# Patient Record
Sex: Male | Born: 1950
Health system: Southern US, Community
[De-identification: ages and names within clinical notes are randomized; demographics above are authoritative.]

## PROBLEM LIST (undated history)

## (undated) DIAGNOSIS — K5090 Crohn's disease, unspecified, without complications: Secondary | ICD-10-CM

## (undated) DIAGNOSIS — R972 Elevated prostate specific antigen [PSA]: Secondary | ICD-10-CM

## (undated) DIAGNOSIS — I10 Essential (primary) hypertension: Secondary | ICD-10-CM

## (undated) DIAGNOSIS — J302 Other seasonal allergic rhinitis: Secondary | ICD-10-CM

## (undated) HISTORY — PX: KNEE SURGERY: SHX244

## (undated) HISTORY — DX: Essential (primary) hypertension: I10

## (undated) HISTORY — PX: HIP SURGERY: SHX245

## (undated) HISTORY — DX: Elevated prostate specific antigen (PSA): R97.20

---

## 2000-08-07 ENCOUNTER — Encounter: Payer: Self-pay | Admitting: Orthopedic Surgery

## 2000-08-07 ENCOUNTER — Ambulatory Visit (HOSPITAL_COMMUNITY): Admission: RE | Admit: 2000-08-07 | Discharge: 2000-08-07 | Payer: Self-pay | Admitting: Orthopedic Surgery

## 2000-12-11 HISTORY — PX: OTHER SURGICAL HISTORY: SHX169

## 2001-01-28 ENCOUNTER — Encounter: Payer: Self-pay | Admitting: Orthopedic Surgery

## 2001-01-28 ENCOUNTER — Ambulatory Visit (HOSPITAL_COMMUNITY): Admission: RE | Admit: 2001-01-28 | Discharge: 2001-01-28 | Payer: Self-pay | Admitting: Orthopedic Surgery

## 2001-03-18 ENCOUNTER — Encounter: Payer: Self-pay | Admitting: Orthopedic Surgery

## 2001-03-20 ENCOUNTER — Ambulatory Visit (HOSPITAL_COMMUNITY): Admission: AD | Admit: 2001-03-20 | Discharge: 2001-03-20 | Payer: Self-pay | Admitting: Orthopedic Surgery

## 2001-03-20 ENCOUNTER — Encounter: Payer: Self-pay | Admitting: Orthopedic Surgery

## 2011-12-12 HISTORY — PX: ROOT CANAL: SHX2363

## 2012-12-11 HISTORY — PX: MOUTH SURGERY: SHX715

## 2014-07-11 ENCOUNTER — Encounter: Payer: Self-pay | Admitting: *Deleted

## 2016-06-29 ENCOUNTER — Other Ambulatory Visit: Payer: Self-pay | Admitting: Orthopedic Surgery

## 2016-06-29 DIAGNOSIS — M1711 Unilateral primary osteoarthritis, right knee: Secondary | ICD-10-CM

## 2016-07-08 ENCOUNTER — Ambulatory Visit
Admission: RE | Admit: 2016-07-08 | Discharge: 2016-07-08 | Disposition: A | Discharge status: Home / Self Care | Payer: BLUE CROSS/BLUE SHIELD | Source: Ambulatory Visit | Attending: Orthopedic Surgery | Admitting: Orthopedic Surgery

## 2016-07-08 DIAGNOSIS — M1711 Unilateral primary osteoarthritis, right knee: Secondary | ICD-10-CM

## 2016-10-16 ENCOUNTER — Encounter (HOSPITAL_COMMUNITY): Payer: Self-pay | Admitting: Emergency Medicine

## 2016-10-16 ENCOUNTER — Emergency Department (HOSPITAL_COMMUNITY)
Admission: EM | Admit: 2016-10-16 | Discharge: 2016-10-17 | Disposition: A | Discharge status: Home / Self Care | Payer: BLUE CROSS/BLUE SHIELD | Attending: Emergency Medicine | Admitting: Emergency Medicine

## 2016-10-16 ENCOUNTER — Emergency Department (HOSPITAL_COMMUNITY): Payer: BLUE CROSS/BLUE SHIELD

## 2016-10-16 DIAGNOSIS — R109 Unspecified abdominal pain: Secondary | ICD-10-CM

## 2016-10-16 DIAGNOSIS — Z79899 Other long term (current) drug therapy: Secondary | ICD-10-CM | POA: Diagnosis not present

## 2016-10-16 DIAGNOSIS — Z7982 Long term (current) use of aspirin: Secondary | ICD-10-CM | POA: Insufficient documentation

## 2016-10-16 DIAGNOSIS — R1032 Left lower quadrant pain: Secondary | ICD-10-CM | POA: Diagnosis not present

## 2016-10-16 DIAGNOSIS — I10 Essential (primary) hypertension: Secondary | ICD-10-CM | POA: Diagnosis not present

## 2016-10-16 HISTORY — DX: Crohn's disease, unspecified, without complications: K50.90

## 2016-10-16 LAB — URINALYSIS, ROUTINE W REFLEX MICROSCOPIC
BILIRUBIN URINE: NEGATIVE
GLUCOSE, UA: NEGATIVE mg/dL
KETONES UR: 40 mg/dL — AB
Leukocytes, UA: NEGATIVE
Nitrite: NEGATIVE
PH: 5.5 (ref 5.0–8.0)
Protein, ur: NEGATIVE mg/dL
Specific Gravity, Urine: 1.01 (ref 1.005–1.030)

## 2016-10-16 LAB — CBC WITH DIFFERENTIAL/PLATELET
Basophils Absolute: 0 10*3/uL (ref 0.0–0.1)
Basophils Relative: 0 %
EOS ABS: 0.1 10*3/uL (ref 0.0–0.7)
Eosinophils Relative: 0 %
HEMATOCRIT: 41.2 % (ref 39.0–52.0)
HEMOGLOBIN: 13.8 g/dL (ref 13.0–17.0)
LYMPHS ABS: 0.4 10*3/uL — AB (ref 0.7–4.0)
LYMPHS PCT: 3 %
MCH: 30.3 pg (ref 26.0–34.0)
MCHC: 33.5 g/dL (ref 30.0–36.0)
MCV: 90.4 fL (ref 78.0–100.0)
Monocytes Absolute: 0.9 10*3/uL (ref 0.1–1.0)
Monocytes Relative: 7 %
NEUTROS ABS: 10.8 10*3/uL — AB (ref 1.7–7.7)
NEUTROS PCT: 90 %
Platelets: 195 10*3/uL (ref 150–400)
RBC: 4.56 MIL/uL (ref 4.22–5.81)
RDW: 13.6 % (ref 11.5–15.5)
WBC: 12.2 10*3/uL — AB (ref 4.0–10.5)

## 2016-10-16 LAB — COMPREHENSIVE METABOLIC PANEL
ALBUMIN: 3.4 g/dL — AB (ref 3.5–5.0)
ALK PHOS: 65 U/L (ref 38–126)
ALT: 26 U/L (ref 17–63)
AST: 30 U/L (ref 15–41)
Anion gap: 9 (ref 5–15)
BUN: 12 mg/dL (ref 6–20)
CALCIUM: 9.3 mg/dL (ref 8.9–10.3)
CO2: 28 mmol/L (ref 22–32)
CREATININE: 1.14 mg/dL (ref 0.61–1.24)
Chloride: 101 mmol/L (ref 101–111)
GFR calc non Af Amer: >60 mL/min (ref >60)
GLUCOSE: 121 mg/dL — AB (ref 65–99)
Potassium: 3.4 mmol/L — ABNORMAL LOW (ref 3.5–5.1)
SODIUM: 138 mmol/L (ref 135–145)
Total Bilirubin: 1 mg/dL (ref 0.3–1.2)
Total Protein: 7.2 g/dL (ref 6.5–8.1)

## 2016-10-16 LAB — URINE MICROSCOPIC-ADD ON: Squamous Epithelial / LPF: NONE SEEN

## 2016-10-16 MED ORDER — MORPHINE SULFATE (PF) 4 MG/ML IV SOLN
4.0000 mg | Freq: Once | INTRAVENOUS | Status: AC
  Administered 2016-10-16: 4 mg via INTRAVENOUS
  Filled 2016-10-16: qty 1

## 2016-10-16 MED ORDER — SODIUM CHLORIDE 0.9 % IV BOLUS (SEPSIS)
500.0000 mL | Freq: Once | INTRAVENOUS | Status: AC
  Administered 2016-10-16: 500 mL via INTRAVENOUS

## 2016-10-16 MED ORDER — IOPAMIDOL (ISOVUE-300) INJECTION 61%
INTRAVENOUS | Status: AC
  Administered 2016-10-16: 100 mL
  Filled 2016-10-16: qty 100

## 2016-10-16 NOTE — ED Triage Notes (Signed)
Pt c/o mid lower abd pain and lower back pain.  Onset 3 days ago.  Pt denies any nausea, vomiting or diarrhea.  Last BM 2 days ago.  Pt st's he has hx of crohn's but has not had a flare up in 30-35 yrs.

## 2016-10-16 NOTE — ED Notes (Signed)
Lisa Sanders, PA at bedside at this time.  

## 2016-10-16 NOTE — ED Notes (Signed)
Patient transported to CT 

## 2016-10-16 NOTE — ED Provider Notes (Signed)
[facility] DEPT Provider Note   CSN: [id number] Arrival date & time: 10/16/16  1700     History   Chief Complaint Chief Complaint  Patient presents with  . Abdominal Pain    HPI Jeremy Casey is a 65 y.o. male.  The history is provided by the patient and medical records.  Abdominal Pain    65 year old male with history of Crohn's disease, hypertension, presenting to the ED for abdominal pain. States this began 3 days ago after eating. States generally when this occurs, he may be sore for a few hours and usually will resolve. He reports this episode has remained persistent. Reports pain is gradually worsening over the past 3 days. States he also has decreased appetite. Denies any nausea, vomiting, or diarrhea. No fever or chills. No prior abdominal surgeries. Reports he has had colonoscopy in the past which she reports was normal. Wife reports patient was taking naproxen last week, and short this is related to his symptoms. He has no history of gastric ulcers. No blood in the stool.  No urinary symptoms or difficulty urinating.  Past Medical History:  Diagnosis Date  . Crohn's disease ([place])   . Elevated PSA   . Essential hypertension, benign    [facility]    There are no active problems to display for this patient.   Past Surgical History:  Procedure Laterality Date  . HIP SURGERY    . KNEE SURGERY    . MOUTH SURGERY  2014   repeat from root canal, 2 teeth pulled  . PE tube placement  2002   secondary to serous otitis  . ROOT CANAL  2013   failed       Home Medications    Prior to Admission medications   Medication Sig Start Date End Date Taking? Authorizing Provider  aspirin 81 MG chewable tablet Chew 81 mg by mouth daily.   Yes Historical Provider, MD  fluticasone (FLONASE) 50 MCG/ACT nasal spray Place into both nostrils daily.   Yes Historical Provider, MD  olmesartan-hydrochlorothiazide (BENICAR HCT) 20-12.5 MG per tablet Take 0.5 tablets by mouth daily.     Yes Historical Provider, MD    Family History Family History  Problem Relation Age of Onset  . Hodgkin's lymphoma Father   . Cancer Father   . ALS Mother   . Hypertension Brother   . Heart attack      Social History Social History  Substance Use Topics  . Smoking status: Never Smoker  . Smokeless tobacco: Never Used  . Alcohol use Yes     Allergies   Patient has no known allergies.   Review of Systems Review of Systems  Gastrointestinal: Positive for abdominal pain.  All other systems reviewed and are negative.    Physical Exam Updated Vital Signs BP 151/90 (BP Location: Right Arm)   Pulse 66   Temp 98.5 F (36.9 C) (Oral)   Resp 18   Ht '5\' 8"'$  (1.727 m)   Wt 90.7 kg   SpO2 98%   BMI 30.41 kg/m   Physical Exam  Constitutional: He is oriented to person, place, and time. He appears well-developed and well-nourished.  HENT:  Head: Normocephalic and atraumatic.  Mouth/Throat: Oropharynx is clear and moist.  Eyes: Conjunctivae and EOM are normal. Pupils are equal, round, and reactive to light.  Neck: Normal range of motion.  Cardiovascular: Normal rate, regular rhythm and normal heart sounds.   Pulmonary/Chest: Effort normal and breath sounds normal.  Abdominal: Soft. Bowel  sounds are normal. There is tenderness. There is no tenderness at McBurney's point and negative Murphy's sign.    Abdomen soft, non-distended, normal bowel sounds, tenderness in lower abdomen, mostly suprapubic and LLQ  Musculoskeletal: Normal range of motion.  Neurological: He is alert and oriented to person, place, and time.  Skin: Skin is warm and dry.  Psychiatric: He has a normal mood and affect.  Nursing note and vitals reviewed.    ED Treatments / Results  Labs (all labs ordered are listed, but only abnormal results are displayed) Labs Reviewed  CBC WITH DIFFERENTIAL/PLATELET - Abnormal; Notable for the following:       Result Value   WBC 12.2 (*)    Neutro Abs 10.8 (*)      Lymphs Abs 0.4 (*)    All other components within normal limits  COMPREHENSIVE METABOLIC PANEL - Abnormal; Notable for the following:    Potassium 3.4 (*)    Glucose, Bld 121 (*)    Albumin 3.4 (*)    All other components within normal limits  URINALYSIS, ROUTINE W REFLEX MICROSCOPIC (NOT AT [facility]) - Abnormal; Notable for the following:    Hgb urine dipstick TRACE (*)    Ketones, ur 40 (*)    All other components within normal limits  URINE MICROSCOPIC-ADD ON - Abnormal; Notable for the following:    Bacteria, UA RARE (*)    All other components within normal limits    EKG  EKG Interpretation None       Radiology Ct Abdomen Pelvis W Contrast  Result Date: 10/16/2016 CLINICAL DATA:  Mid abdominal pain for 3 days.  Loss of appetite. EXAM: CT ABDOMEN AND PELVIS WITH CONTRAST TECHNIQUE: Multidetector CT imaging of the abdomen and pelvis was performed using the standard protocol following bolus administration of intravenous contrast. CONTRAST:  153m ISOVUE-300 IOPAMIDOL (ISOVUE-300) INJECTION 61% COMPARISON:  None. FINDINGS: Lower chest: No pulmonary nodules. No visible pleural or pericardial effusion. Hepatobiliary: Normal hepatic size and contours without focal liver lesion. No perihepatic ascites. No intra- or extrahepatic biliary dilatation. There is a large, lamellated stone within the gallbladder neck, measuring up to 2.6 cm. There is mild inflammatory stranding adjacent to the gallbladder fundus. Pancreas: Normal pancreatic contours and enhancement. No peripancreatic fluid collection or pancreatic ductal dilatation. Spleen: Normal. Adrenals/Urinary Tract: Normal adrenal glands. No hydronephrosis or solid renal mass. Stomach/Bowel: There is rectosigmoid diverticulosis without acute inflammation. No dilated small bowel. No evidence of acute inflammation. No abdominal fluid collection. Normal appendix. Vascular/Lymphatic: Normal course and caliber of the major abdominal vessels. No  abdominal or pelvic adenopathy. Reproductive: The prostate is mildly enlarged and heterogeneous. The seminal vesicles are mildly enlarged. Musculoskeletal: Lower lumbar facet arthrosis. No bony spinal canal stenosis. No lytic or blastic lesions. Normal visualized extrathoracic and extraperitoneal soft tissues. Other: No contributory non-categorized findings. IMPRESSION: 1. Large, lamellated stone within the gallbladder neck. Mild inflammatory stranding at the gallbladder fundus. These findings could indicate acute cholecystitis. Nuclear medicine hepatobiliary scan may be confirmatory. 2. No evidence of active inflammatory bowel disease. 3. Rectosigmoid diverticulosis without acute diverticulitis. Electronically Signed   By: KUlyses JarredM.D.   On: 10/16/2016 22:14    Procedures Procedures (including critical care time)  Medications Ordered in ED Medications  morphine 4 MG/ML injection 4 mg (4 mg Intravenous Given 10/16/16 2058)  sodium chloride 0.9 % bolus 500 mL (0 mLs Intravenous Stopped 10/17/16 0032)  iopamidol (ISOVUE-300) 61 % injection (100 mLs  Contrast Given 10/16/16 2139)  morphine  4 MG/ML injection 4 mg (4 mg Intravenous Given 10/16/16 2349)     Initial Impression / Assessment and Plan / ED Course  I have reviewed the triage vital signs and the nursing notes.  Pertinent labs & imaging results that were available during my care of the patient were reviewed by me and considered in my medical decision making (see chart for details).  Clinical Course    65 year old male here with abdominal pain. He is afebrile and nontoxic. Pain is mostly localized to the suprapubic and left lower quadrant. No rebound or guarding on exam. Does report history of Crohn's, states flares are usually short-lived. Lab work with mild leukocytosis. Will plan for CT scan for further evaluation.  11:38 PM Patient reassessed. Pain is better, starting to come back.  Additional meds ordered.  On repeat exam, still  only endorses pain in suprapubic and LLQ region. No TTP in RUQ, no murphy's sign.  Normal LFT's and alk phos.  CT scan revealing stone in the gallbladder neck, however given exam findings and reassuring labs, feel this is less likely the etiology of this pain and rather an incidental finding. Patient drinking Sprite at this time. UA pending.  12:10 AM Patient is tolerated oral fluids well at this time.  UA without signs of infection.  Will d/c home with supportive care.  Recommend close follow-up with PCP and/or GI if symptoms not improving in the next few days.  Discussed plan with patient, he/she acknowledged understanding and agreed with plan of care.  Return precautions given for new or worsening symptoms.  Case discussed with attending physician, Dr. Leonette Monarch, who agrees with assessment and plan of care.  Final Clinical Impressions(s) / ED Diagnoses   Final diagnoses:  Abdominal pain, unspecified abdominal location    New Prescriptions Discharge Medication List as of 10/17/2016 12:24 AM    START taking these medications   Details  ondansetron (ZOFRAN ODT) 4 MG disintegrating tablet Take 1 tablet (4 mg total) by mouth every 8 (eight) hours as needed for nausea., Starting Tue 10/17/2016, Print    oxyCODONE-acetaminophen (PERCOCET/ROXICET) 5-325 MG tablet Take 1 tablet by mouth every 4 (four) hours as needed., Starting Tue 10/17/2016, Print         Larene Pickett, PA-C 10/17/16 0139    Fatima Blank, MD 10/17/16 2359

## 2016-10-16 NOTE — ED Notes (Addendum)
Patient returned from CT

## 2016-10-17 MED ORDER — OXYCODONE-ACETAMINOPHEN 5-325 MG PO TABS: 1.0000 | ORAL_TABLET | ORAL | 0 refills | Status: DC | PRN

## 2016-10-17 MED ORDER — ONDANSETRON 4 MG PO TBDP: 4.0000 mg | ORAL_TABLET | Freq: Three times a day (TID) | ORAL | 0 refills | Status: DC | PRN

## 2016-10-17 NOTE — Discharge Instructions (Signed)
Take the prescribed medication as directed. Start with gentle diet, progress back to normal as tolerated. Follow-up with your primary care doctor. Return to the ED for new or worsening symptoms-- high fever, uncontrolled pain, nausea/vomiting, etc.

## 2016-10-18 DIAGNOSIS — I10 Essential (primary) hypertension: Secondary | ICD-10-CM | POA: Insufficient documentation

## 2016-10-18 DIAGNOSIS — K5090 Crohn's disease, unspecified, without complications: Secondary | ICD-10-CM | POA: Insufficient documentation

## 2017-08-05 DIAGNOSIS — H66001 Acute suppurative otitis media without spontaneous rupture of ear drum, right ear: Secondary | ICD-10-CM | POA: Diagnosis not present

## 2017-08-06 DIAGNOSIS — L718 Other rosacea: Secondary | ICD-10-CM | POA: Diagnosis not present

## 2017-08-06 DIAGNOSIS — D225 Melanocytic nevi of trunk: Secondary | ICD-10-CM | POA: Diagnosis not present

## 2017-08-06 DIAGNOSIS — Z85828 Personal history of other malignant neoplasm of skin: Secondary | ICD-10-CM | POA: Diagnosis not present

## 2017-08-06 DIAGNOSIS — D1801 Hemangioma of skin and subcutaneous tissue: Secondary | ICD-10-CM | POA: Diagnosis not present

## 2017-08-06 DIAGNOSIS — L814 Other melanin hyperpigmentation: Secondary | ICD-10-CM | POA: Diagnosis not present

## 2017-08-06 DIAGNOSIS — L570 Actinic keratosis: Secondary | ICD-10-CM | POA: Diagnosis not present

## 2017-08-06 DIAGNOSIS — L820 Inflamed seborrheic keratosis: Secondary | ICD-10-CM | POA: Diagnosis not present

## 2017-08-06 DIAGNOSIS — B078 Other viral warts: Secondary | ICD-10-CM | POA: Diagnosis not present

## 2017-08-06 DIAGNOSIS — L821 Other seborrheic keratosis: Secondary | ICD-10-CM | POA: Diagnosis not present

## 2017-08-08 DIAGNOSIS — I10 Essential (primary) hypertension: Secondary | ICD-10-CM | POA: Diagnosis not present

## 2017-08-08 DIAGNOSIS — H60391 Other infective otitis externa, right ear: Secondary | ICD-10-CM | POA: Diagnosis not present

## 2017-09-12 DIAGNOSIS — M1711 Unilateral primary osteoarthritis, right knee: Secondary | ICD-10-CM | POA: Diagnosis not present

## 2017-09-13 DIAGNOSIS — Z23 Encounter for immunization: Secondary | ICD-10-CM | POA: Diagnosis not present

## 2017-09-19 DIAGNOSIS — M1711 Unilateral primary osteoarthritis, right knee: Secondary | ICD-10-CM | POA: Diagnosis not present

## 2017-09-26 DIAGNOSIS — M1711 Unilateral primary osteoarthritis, right knee: Secondary | ICD-10-CM | POA: Diagnosis not present

## 2017-10-03 DIAGNOSIS — H6591 Unspecified nonsuppurative otitis media, right ear: Secondary | ICD-10-CM | POA: Diagnosis not present

## 2017-10-11 DIAGNOSIS — J309 Allergic rhinitis, unspecified: Secondary | ICD-10-CM | POA: Diagnosis not present

## 2017-10-11 DIAGNOSIS — H65191 Other acute nonsuppurative otitis media, right ear: Secondary | ICD-10-CM | POA: Diagnosis not present

## 2017-10-11 DIAGNOSIS — J329 Chronic sinusitis, unspecified: Secondary | ICD-10-CM | POA: Diagnosis not present

## 2017-10-11 DIAGNOSIS — Z8669 Personal history of other diseases of the nervous system and sense organs: Secondary | ICD-10-CM | POA: Diagnosis not present

## 2017-10-31 DIAGNOSIS — H6502 Acute serous otitis media, left ear: Secondary | ICD-10-CM | POA: Diagnosis not present

## 2017-10-31 DIAGNOSIS — J329 Chronic sinusitis, unspecified: Secondary | ICD-10-CM | POA: Diagnosis not present

## 2017-10-31 DIAGNOSIS — R05 Cough: Secondary | ICD-10-CM | POA: Diagnosis not present

## 2017-10-31 DIAGNOSIS — J300 Vasomotor rhinitis: Secondary | ICD-10-CM | POA: Diagnosis not present

## 2017-10-31 DIAGNOSIS — H6690 Otitis media, unspecified, unspecified ear: Secondary | ICD-10-CM | POA: Diagnosis not present

## 2017-11-14 DIAGNOSIS — R0981 Nasal congestion: Secondary | ICD-10-CM | POA: Diagnosis not present

## 2017-12-13 DIAGNOSIS — R05 Cough: Secondary | ICD-10-CM | POA: Diagnosis not present

## 2017-12-13 DIAGNOSIS — H6690 Otitis media, unspecified, unspecified ear: Secondary | ICD-10-CM | POA: Diagnosis not present

## 2017-12-13 DIAGNOSIS — J329 Chronic sinusitis, unspecified: Secondary | ICD-10-CM | POA: Diagnosis not present

## 2017-12-13 DIAGNOSIS — J300 Vasomotor rhinitis: Secondary | ICD-10-CM | POA: Diagnosis not present

## 2017-12-13 DIAGNOSIS — H6502 Acute serous otitis media, left ear: Secondary | ICD-10-CM | POA: Diagnosis not present

## 2017-12-18 ENCOUNTER — Other Ambulatory Visit: Payer: Self-pay | Admitting: Allergy

## 2017-12-18 ENCOUNTER — Ambulatory Visit (INDEPENDENT_AMBULATORY_CARE_PROVIDER_SITE_OTHER): Payer: Medicare Other

## 2017-12-18 DIAGNOSIS — R05 Cough: Secondary | ICD-10-CM

## 2017-12-18 DIAGNOSIS — R059 Cough, unspecified: Secondary | ICD-10-CM

## 2017-12-27 DIAGNOSIS — L02212 Cutaneous abscess of back [any part, except buttock]: Secondary | ICD-10-CM | POA: Diagnosis not present

## 2018-01-17 DIAGNOSIS — H6690 Otitis media, unspecified, unspecified ear: Secondary | ICD-10-CM | POA: Diagnosis not present

## 2018-01-17 DIAGNOSIS — H6502 Acute serous otitis media, left ear: Secondary | ICD-10-CM | POA: Diagnosis not present

## 2018-01-17 DIAGNOSIS — J300 Vasomotor rhinitis: Secondary | ICD-10-CM | POA: Diagnosis not present

## 2018-01-17 DIAGNOSIS — R05 Cough: Secondary | ICD-10-CM | POA: Diagnosis not present

## 2018-01-21 DIAGNOSIS — H6993 Unspecified Eustachian tube disorder, bilateral: Secondary | ICD-10-CM | POA: Diagnosis not present

## 2018-01-22 DIAGNOSIS — D485 Neoplasm of uncertain behavior of skin: Secondary | ICD-10-CM | POA: Diagnosis not present

## 2018-01-22 DIAGNOSIS — C44329 Squamous cell carcinoma of skin of other parts of face: Secondary | ICD-10-CM | POA: Diagnosis not present

## 2018-01-22 DIAGNOSIS — L821 Other seborrheic keratosis: Secondary | ICD-10-CM | POA: Diagnosis not present

## 2018-01-22 DIAGNOSIS — L578 Other skin changes due to chronic exposure to nonionizing radiation: Secondary | ICD-10-CM | POA: Diagnosis not present

## 2018-01-22 DIAGNOSIS — D1801 Hemangioma of skin and subcutaneous tissue: Secondary | ICD-10-CM | POA: Diagnosis not present

## 2018-01-22 DIAGNOSIS — L905 Scar conditions and fibrosis of skin: Secondary | ICD-10-CM | POA: Diagnosis not present

## 2018-02-12 DIAGNOSIS — L570 Actinic keratosis: Secondary | ICD-10-CM | POA: Diagnosis not present

## 2018-02-12 DIAGNOSIS — D485 Neoplasm of uncertain behavior of skin: Secondary | ICD-10-CM | POA: Diagnosis not present

## 2018-02-12 DIAGNOSIS — C44329 Squamous cell carcinoma of skin of other parts of face: Secondary | ICD-10-CM | POA: Diagnosis not present

## 2018-04-04 DIAGNOSIS — L814 Other melanin hyperpigmentation: Secondary | ICD-10-CM | POA: Diagnosis not present

## 2018-04-04 DIAGNOSIS — L570 Actinic keratosis: Secondary | ICD-10-CM | POA: Diagnosis not present

## 2018-04-04 DIAGNOSIS — Z85828 Personal history of other malignant neoplasm of skin: Secondary | ICD-10-CM | POA: Diagnosis not present

## 2018-04-04 DIAGNOSIS — L821 Other seborrheic keratosis: Secondary | ICD-10-CM | POA: Diagnosis not present

## 2018-04-04 DIAGNOSIS — D225 Melanocytic nevi of trunk: Secondary | ICD-10-CM | POA: Diagnosis not present

## 2018-04-24 DIAGNOSIS — M1711 Unilateral primary osteoarthritis, right knee: Secondary | ICD-10-CM | POA: Diagnosis not present

## 2018-05-01 DIAGNOSIS — M25561 Pain in right knee: Secondary | ICD-10-CM | POA: Diagnosis not present

## 2018-05-01 DIAGNOSIS — Z6830 Body mass index (BMI) 30.0-30.9, adult: Secondary | ICD-10-CM | POA: Diagnosis not present

## 2018-05-01 DIAGNOSIS — Z0000 Encounter for general adult medical examination without abnormal findings: Secondary | ICD-10-CM | POA: Diagnosis not present

## 2018-05-01 DIAGNOSIS — Z1389 Encounter for screening for other disorder: Secondary | ICD-10-CM | POA: Diagnosis not present

## 2018-05-01 DIAGNOSIS — J309 Allergic rhinitis, unspecified: Secondary | ICD-10-CM | POA: Diagnosis not present

## 2018-05-01 DIAGNOSIS — Z125 Encounter for screening for malignant neoplasm of prostate: Secondary | ICD-10-CM | POA: Diagnosis not present

## 2018-05-01 DIAGNOSIS — Z23 Encounter for immunization: Secondary | ICD-10-CM | POA: Diagnosis not present

## 2018-05-01 DIAGNOSIS — I10 Essential (primary) hypertension: Secondary | ICD-10-CM | POA: Diagnosis not present

## 2018-06-04 DIAGNOSIS — R972 Elevated prostate specific antigen [PSA]: Secondary | ICD-10-CM | POA: Diagnosis not present

## 2018-06-18 DIAGNOSIS — M1711 Unilateral primary osteoarthritis, right knee: Secondary | ICD-10-CM | POA: Diagnosis not present

## 2018-06-18 DIAGNOSIS — G8918 Other acute postprocedural pain: Secondary | ICD-10-CM | POA: Diagnosis not present

## 2018-06-19 DIAGNOSIS — M25561 Pain in right knee: Secondary | ICD-10-CM | POA: Diagnosis not present

## 2018-06-19 DIAGNOSIS — M1711 Unilateral primary osteoarthritis, right knee: Secondary | ICD-10-CM | POA: Insufficient documentation

## 2018-06-20 DIAGNOSIS — M25561 Pain in right knee: Secondary | ICD-10-CM | POA: Diagnosis not present

## 2018-06-24 DIAGNOSIS — M25561 Pain in right knee: Secondary | ICD-10-CM | POA: Diagnosis not present

## 2018-06-26 DIAGNOSIS — M25561 Pain in right knee: Secondary | ICD-10-CM | POA: Diagnosis not present

## 2018-07-01 DIAGNOSIS — M25561 Pain in right knee: Secondary | ICD-10-CM | POA: Diagnosis not present

## 2018-07-01 DIAGNOSIS — M1711 Unilateral primary osteoarthritis, right knee: Secondary | ICD-10-CM | POA: Diagnosis not present

## 2018-07-03 DIAGNOSIS — M1711 Unilateral primary osteoarthritis, right knee: Secondary | ICD-10-CM | POA: Diagnosis not present

## 2018-07-03 DIAGNOSIS — M25561 Pain in right knee: Secondary | ICD-10-CM | POA: Diagnosis not present

## 2018-07-05 DIAGNOSIS — M25561 Pain in right knee: Secondary | ICD-10-CM | POA: Diagnosis not present

## 2018-07-08 DIAGNOSIS — M25561 Pain in right knee: Secondary | ICD-10-CM | POA: Diagnosis not present

## 2018-07-11 DIAGNOSIS — M25561 Pain in right knee: Secondary | ICD-10-CM | POA: Diagnosis not present

## 2018-07-15 DIAGNOSIS — M25561 Pain in right knee: Secondary | ICD-10-CM | POA: Diagnosis not present

## 2018-07-18 DIAGNOSIS — M25561 Pain in right knee: Secondary | ICD-10-CM | POA: Diagnosis not present

## 2018-07-18 DIAGNOSIS — M1711 Unilateral primary osteoarthritis, right knee: Secondary | ICD-10-CM | POA: Diagnosis not present

## 2018-07-22 DIAGNOSIS — M1711 Unilateral primary osteoarthritis, right knee: Secondary | ICD-10-CM | POA: Diagnosis not present

## 2018-07-22 DIAGNOSIS — M25561 Pain in right knee: Secondary | ICD-10-CM | POA: Diagnosis not present

## 2018-07-25 DIAGNOSIS — M25561 Pain in right knee: Secondary | ICD-10-CM | POA: Diagnosis not present

## 2018-08-19 DIAGNOSIS — M25561 Pain in right knee: Secondary | ICD-10-CM | POA: Diagnosis not present

## 2018-08-19 DIAGNOSIS — Z4789 Encounter for other orthopedic aftercare: Secondary | ICD-10-CM | POA: Diagnosis not present

## 2018-08-19 DIAGNOSIS — M25361 Other instability, right knee: Secondary | ICD-10-CM | POA: Diagnosis not present

## 2018-08-23 DIAGNOSIS — Z23 Encounter for immunization: Secondary | ICD-10-CM | POA: Diagnosis not present

## 2018-08-23 DIAGNOSIS — H6123 Impacted cerumen, bilateral: Secondary | ICD-10-CM | POA: Diagnosis not present

## 2018-10-09 DIAGNOSIS — M25561 Pain in right knee: Secondary | ICD-10-CM | POA: Diagnosis not present

## 2018-10-24 DIAGNOSIS — L814 Other melanin hyperpigmentation: Secondary | ICD-10-CM | POA: Diagnosis not present

## 2018-10-24 DIAGNOSIS — L570 Actinic keratosis: Secondary | ICD-10-CM | POA: Diagnosis not present

## 2018-10-24 DIAGNOSIS — Z85828 Personal history of other malignant neoplasm of skin: Secondary | ICD-10-CM | POA: Diagnosis not present

## 2018-10-24 DIAGNOSIS — L821 Other seborrheic keratosis: Secondary | ICD-10-CM | POA: Diagnosis not present

## 2018-10-24 DIAGNOSIS — L578 Other skin changes due to chronic exposure to nonionizing radiation: Secondary | ICD-10-CM | POA: Diagnosis not present

## 2018-10-24 DIAGNOSIS — D225 Melanocytic nevi of trunk: Secondary | ICD-10-CM | POA: Diagnosis not present

## 2018-10-29 DIAGNOSIS — M1711 Unilateral primary osteoarthritis, right knee: Secondary | ICD-10-CM | POA: Diagnosis not present

## 2018-10-30 DIAGNOSIS — S83281A Other tear of lateral meniscus, current injury, right knee, initial encounter: Secondary | ICD-10-CM | POA: Diagnosis not present

## 2018-11-12 DIAGNOSIS — X58XXXA Exposure to other specified factors, initial encounter: Secondary | ICD-10-CM | POA: Diagnosis not present

## 2018-11-12 DIAGNOSIS — Y999 Unspecified external cause status: Secondary | ICD-10-CM | POA: Diagnosis not present

## 2018-11-12 DIAGNOSIS — Z96651 Presence of right artificial knee joint: Secondary | ICD-10-CM | POA: Diagnosis not present

## 2018-11-12 DIAGNOSIS — S83271A Complex tear of lateral meniscus, current injury, right knee, initial encounter: Secondary | ICD-10-CM | POA: Diagnosis not present

## 2018-11-12 DIAGNOSIS — M94261 Chondromalacia, right knee: Secondary | ICD-10-CM | POA: Diagnosis not present

## 2018-11-19 DIAGNOSIS — H6591 Unspecified nonsuppurative otitis media, right ear: Secondary | ICD-10-CM | POA: Diagnosis not present

## 2018-11-19 DIAGNOSIS — I10 Essential (primary) hypertension: Secondary | ICD-10-CM | POA: Diagnosis not present

## 2018-11-19 DIAGNOSIS — H66001 Acute suppurative otitis media without spontaneous rupture of ear drum, right ear: Secondary | ICD-10-CM | POA: Diagnosis not present

## 2018-11-20 DIAGNOSIS — M25561 Pain in right knee: Secondary | ICD-10-CM | POA: Diagnosis not present

## 2018-11-27 DIAGNOSIS — H66004 Acute suppurative otitis media without spontaneous rupture of ear drum, recurrent, right ear: Secondary | ICD-10-CM | POA: Diagnosis not present

## 2018-12-03 DIAGNOSIS — M25561 Pain in right knee: Secondary | ICD-10-CM | POA: Diagnosis not present

## 2018-12-09 DIAGNOSIS — M25561 Pain in right knee: Secondary | ICD-10-CM | POA: Diagnosis not present

## 2018-12-10 DIAGNOSIS — H6521 Chronic serous otitis media, right ear: Secondary | ICD-10-CM | POA: Diagnosis not present

## 2018-12-10 DIAGNOSIS — H906 Mixed conductive and sensorineural hearing loss, bilateral: Secondary | ICD-10-CM | POA: Diagnosis not present

## 2018-12-12 DIAGNOSIS — R972 Elevated prostate specific antigen [PSA]: Secondary | ICD-10-CM | POA: Diagnosis not present

## 2018-12-13 DIAGNOSIS — M25561 Pain in right knee: Secondary | ICD-10-CM | POA: Diagnosis not present

## 2018-12-17 DIAGNOSIS — M25561 Pain in right knee: Secondary | ICD-10-CM | POA: Diagnosis not present

## 2018-12-19 DIAGNOSIS — L578 Other skin changes due to chronic exposure to nonionizing radiation: Secondary | ICD-10-CM | POA: Diagnosis not present

## 2018-12-19 DIAGNOSIS — Z85828 Personal history of other malignant neoplasm of skin: Secondary | ICD-10-CM | POA: Diagnosis not present

## 2018-12-19 DIAGNOSIS — L570 Actinic keratosis: Secondary | ICD-10-CM | POA: Diagnosis not present

## 2018-12-19 DIAGNOSIS — L820 Inflamed seborrheic keratosis: Secondary | ICD-10-CM | POA: Diagnosis not present

## 2018-12-20 DIAGNOSIS — M25561 Pain in right knee: Secondary | ICD-10-CM | POA: Diagnosis not present

## 2019-01-29 DIAGNOSIS — H6531 Chronic mucoid otitis media, right ear: Secondary | ICD-10-CM | POA: Diagnosis not present

## 2019-02-18 DIAGNOSIS — R972 Elevated prostate specific antigen [PSA]: Secondary | ICD-10-CM | POA: Diagnosis not present

## 2019-02-20 DIAGNOSIS — L718 Other rosacea: Secondary | ICD-10-CM | POA: Diagnosis not present

## 2019-02-20 DIAGNOSIS — L570 Actinic keratosis: Secondary | ICD-10-CM | POA: Diagnosis not present

## 2019-02-20 DIAGNOSIS — L820 Inflamed seborrheic keratosis: Secondary | ICD-10-CM | POA: Diagnosis not present

## 2019-02-20 DIAGNOSIS — L578 Other skin changes due to chronic exposure to nonionizing radiation: Secondary | ICD-10-CM | POA: Diagnosis not present

## 2019-03-06 DIAGNOSIS — R351 Nocturia: Secondary | ICD-10-CM | POA: Diagnosis not present

## 2019-03-06 DIAGNOSIS — R972 Elevated prostate specific antigen [PSA]: Secondary | ICD-10-CM | POA: Diagnosis not present

## 2019-04-09 DIAGNOSIS — M722 Plantar fascial fibromatosis: Secondary | ICD-10-CM | POA: Diagnosis not present

## 2019-04-09 DIAGNOSIS — Z96651 Presence of right artificial knee joint: Secondary | ICD-10-CM | POA: Diagnosis not present

## 2019-04-09 DIAGNOSIS — S8391XA Sprain of unspecified site of right knee, initial encounter: Secondary | ICD-10-CM | POA: Diagnosis not present

## 2019-05-07 DIAGNOSIS — R05 Cough: Secondary | ICD-10-CM | POA: Diagnosis not present

## 2019-05-07 DIAGNOSIS — I10 Essential (primary) hypertension: Secondary | ICD-10-CM | POA: Diagnosis not present

## 2019-05-07 DIAGNOSIS — M25561 Pain in right knee: Secondary | ICD-10-CM | POA: Diagnosis not present

## 2019-05-07 DIAGNOSIS — Z0000 Encounter for general adult medical examination without abnormal findings: Secondary | ICD-10-CM | POA: Diagnosis not present

## 2019-05-07 DIAGNOSIS — J309 Allergic rhinitis, unspecified: Secondary | ICD-10-CM | POA: Diagnosis not present

## 2019-05-07 DIAGNOSIS — Z1389 Encounter for screening for other disorder: Secondary | ICD-10-CM | POA: Diagnosis not present

## 2019-05-07 DIAGNOSIS — N401 Enlarged prostate with lower urinary tract symptoms: Secondary | ICD-10-CM | POA: Diagnosis not present

## 2019-06-04 DIAGNOSIS — Z23 Encounter for immunization: Secondary | ICD-10-CM | POA: Diagnosis not present

## 2019-06-04 DIAGNOSIS — I10 Essential (primary) hypertension: Secondary | ICD-10-CM | POA: Diagnosis not present

## 2019-06-09 DIAGNOSIS — Z471 Aftercare following joint replacement surgery: Secondary | ICD-10-CM | POA: Diagnosis not present

## 2019-06-09 DIAGNOSIS — M79672 Pain in left foot: Secondary | ICD-10-CM | POA: Diagnosis not present

## 2019-06-09 DIAGNOSIS — Z96651 Presence of right artificial knee joint: Secondary | ICD-10-CM | POA: Diagnosis not present

## 2019-07-23 DIAGNOSIS — H2513 Age-related nuclear cataract, bilateral: Secondary | ICD-10-CM | POA: Diagnosis not present

## 2019-08-13 DIAGNOSIS — H6123 Impacted cerumen, bilateral: Secondary | ICD-10-CM | POA: Diagnosis not present

## 2019-08-13 DIAGNOSIS — H6993 Unspecified Eustachian tube disorder, bilateral: Secondary | ICD-10-CM | POA: Diagnosis not present

## 2019-09-05 DIAGNOSIS — R351 Nocturia: Secondary | ICD-10-CM | POA: Diagnosis not present

## 2019-09-05 DIAGNOSIS — R972 Elevated prostate specific antigen [PSA]: Secondary | ICD-10-CM | POA: Diagnosis not present

## 2019-09-24 DIAGNOSIS — H903 Sensorineural hearing loss, bilateral: Secondary | ICD-10-CM | POA: Diagnosis not present

## 2019-09-30 DIAGNOSIS — Z23 Encounter for immunization: Secondary | ICD-10-CM | POA: Diagnosis not present

## 2019-10-24 ENCOUNTER — Ambulatory Visit (INDEPENDENT_AMBULATORY_CARE_PROVIDER_SITE_OTHER): Payer: Medicare Other | Admitting: Otolaryngology

## 2019-10-24 ENCOUNTER — Other Ambulatory Visit: Payer: Self-pay

## 2019-10-24 ENCOUNTER — Encounter (INDEPENDENT_AMBULATORY_CARE_PROVIDER_SITE_OTHER): Payer: Self-pay | Admitting: Otolaryngology

## 2019-10-24 VITALS — Temp 97.7°F

## 2019-10-24 DIAGNOSIS — H6122 Impacted cerumen, left ear: Secondary | ICD-10-CM | POA: Diagnosis not present

## 2019-10-24 DIAGNOSIS — H7292 Unspecified perforation of tympanic membrane, left ear: Secondary | ICD-10-CM | POA: Diagnosis not present

## 2019-10-24 NOTE — Progress Notes (Signed)
HPI: Jeremy Casey is a 68 y.o. male who returns today for evaluation of sensation of fluid in the left ear.  He had a T-tube placed in the right ear in December 2019.  He had a Paparella tube placed in the left ear in February 2020.  He has been sensing fluid in the left ear for the past week although today it is doing much better.  He has not noted any drainage from his ear.  Denies any pain or discomfort.  Past Medical History:  Diagnosis Date  . Crohn's disease ([place])   . Elevated PSA   . Essential hypertension, benign    [facility]   Past Surgical History:  Procedure Laterality Date  . HIP SURGERY    . KNEE SURGERY    . MOUTH SURGERY  2014   repeat from root canal, 2 teeth pulled  . PE tube placement  2002   secondary to serous otitis  . ROOT CANAL  2013   failed   Social History   Socioeconomic History  . Marital status: Married    Spouse name: Not on file  . Number of children: 2  . Years of education: Not on file  . Highest education level: Not on file  Occupational History  . Not on file  Social Needs  . Financial resource strain: Not on file  . Food insecurity    Worry: Not on file    Inability: Not on file  . Transportation needs    Medical: Not on file    Non-medical: Not on file  Tobacco Use  . Smoking status: Never Smoker  . Smokeless tobacco: Never Used  Substance and Sexual Activity  . Alcohol use: Yes  . Drug use: No  . Sexual activity: Not on file  Lifestyle  . Physical activity    Days per week: Not on file    Minutes per session: Not on file  . Stress: Not on file  Relationships  . Social Herbalist on phone: Not on file    Gets together: Not on file    Attends religious service: Not on file    Active member of club or organization: Not on file    Attends meetings of clubs or organizations: Not on file    Relationship status: Not on file  Other Topics Concern  . Not on file  Social History Narrative  . Not on file   Family  History  Problem Relation Age of Onset  . Hodgkin's lymphoma Father   . Cancer Father   . ALS Mother   . Hypertension Brother   . Heart attack Unknown    No Known Allergies Prior to Admission medications   Medication Sig Start Date End Date Taking? Authorizing Provider  aspirin 81 MG chewable tablet Chew 81 mg by mouth daily.   Yes [provider]  fluticasone (FLONASE) 50 MCG/ACT nasal spray Place into both nostrils daily.   Yes [provider]  olmesartan-hydrochlorothiazide (BENICAR HCT) 20-12.5 MG per tablet Take 0.5 tablets by mouth daily.    Yes [provider]  ondansetron (ZOFRAN ODT) 4 MG disintegrating tablet Take 1 tablet (4 mg total) by mouth every 8 (eight) hours as needed for nausea. 10/17/16  Yes Larene Pickett, PA-C  oxyCODONE-acetaminophen (PERCOCET/ROXICET) 5-325 MG tablet Take 1 tablet by mouth every 4 (four) hours as needed. 10/17/16  Yes Larene Pickett, PA-C     Positive ROS: Otherwise negative  All other systems  have been reviewed and were otherwise negative with the exception of those mentioned in the HPI and as above.  Physical Exam: General: Alert, no acute distress Ears: Right ear is clear and has a patent T-tube in okay position.  No drainage noted.  Left ear reveals a partially extruded papilloma tube with some scabbing and crusting adjacent to the TM.  No drainage noted.  The tube is beginning to extrude and this was removed in the office today along with surrounding crusting and scabbing.  He had a small residual perforation after removal of the tube.  Middle ear space was dry with no drainage. Nasal: Clear nasal passages.  No signs of infection Oral: Clear oropharynx Neck: No palpable adenopathy or masses  Cerumen impaction removal  Date/Time: 10/24/2019 6:43 PM Performed by: Rozetta Nunnery, MD Authorized by: Rozetta Nunnery, MD   Consent:    Consent obtained:  Verbal   Consent given by:  Patient   Risks  discussed:  Pain and bleeding Procedure details:    Location:  L ear   Procedure type: forceps     Procedure type comment:  Paparella type I tube and surrounding crusting was removed Post-procedure details:    Inspection:  Canal normal (Small TM perforation was noted after removal.  Middle ear space was dry)   Hearing quality:  Normal   Patient tolerance of procedure:  Tolerated well, no immediate complications    Assessment: Partially extruded type I Paparella tube with sensation of fluid.  Plan: This was removed in the office today. I prescribed Floxin drops to use if he develops any drainage from the left ear. Recommended keeping the ear dry and will follow-up if he has any further problems.   Radene Journey, MD

## 2019-10-27 DIAGNOSIS — Z85828 Personal history of other malignant neoplasm of skin: Secondary | ICD-10-CM | POA: Diagnosis not present

## 2019-10-27 DIAGNOSIS — D225 Melanocytic nevi of trunk: Secondary | ICD-10-CM | POA: Diagnosis not present

## 2019-10-27 DIAGNOSIS — L820 Inflamed seborrheic keratosis: Secondary | ICD-10-CM | POA: Diagnosis not present

## 2019-10-27 DIAGNOSIS — L570 Actinic keratosis: Secondary | ICD-10-CM | POA: Diagnosis not present

## 2019-10-27 DIAGNOSIS — L814 Other melanin hyperpigmentation: Secondary | ICD-10-CM | POA: Diagnosis not present

## 2019-10-27 DIAGNOSIS — L821 Other seborrheic keratosis: Secondary | ICD-10-CM | POA: Diagnosis not present

## 2019-10-27 DIAGNOSIS — L718 Other rosacea: Secondary | ICD-10-CM | POA: Diagnosis not present

## 2019-10-28 ENCOUNTER — Encounter (INDEPENDENT_AMBULATORY_CARE_PROVIDER_SITE_OTHER): Payer: Self-pay | Admitting: Otolaryngology

## 2019-10-30 ENCOUNTER — Encounter (INDEPENDENT_AMBULATORY_CARE_PROVIDER_SITE_OTHER): Payer: Self-pay

## 2019-11-10 ENCOUNTER — Ambulatory Visit (INDEPENDENT_AMBULATORY_CARE_PROVIDER_SITE_OTHER): Payer: Medicare Other | Admitting: Otolaryngology

## 2019-11-11 ENCOUNTER — Ambulatory Visit (INDEPENDENT_AMBULATORY_CARE_PROVIDER_SITE_OTHER): Payer: Medicare Other | Admitting: Otolaryngology

## 2019-11-11 ENCOUNTER — Encounter (INDEPENDENT_AMBULATORY_CARE_PROVIDER_SITE_OTHER): Payer: Self-pay | Admitting: Otolaryngology

## 2019-11-11 ENCOUNTER — Other Ambulatory Visit: Payer: Self-pay

## 2019-11-11 VITALS — Temp 98.0°F

## 2019-11-11 DIAGNOSIS — H6692 Otitis media, unspecified, left ear: Secondary | ICD-10-CM

## 2019-11-11 DIAGNOSIS — H7292 Unspecified perforation of tympanic membrane, left ear: Secondary | ICD-10-CM

## 2019-11-11 NOTE — Progress Notes (Signed)
HPI: Jeremy Casey is a 68 y.o. male who returns today for evaluation of drainage from his left ear that started this past Saturday.  He has been using eardrops that were previously prescribed twice a day.  He has previously had tubes placed in both ears and the left tube excluded about a month ago.  He did well for several weeks but then started developing drainage from the left ear 4 days ago and has been using eardrops..  Past Medical History:  Diagnosis Date  . Crohn's disease ([place])   . Elevated PSA   . Essential hypertension, benign    [facility]   Past Surgical History:  Procedure Laterality Date  . HIP SURGERY    . KNEE SURGERY    . MOUTH SURGERY  2014   repeat from root canal, 2 teeth pulled  . PE tube placement  2002   secondary to serous otitis  . ROOT CANAL  2013   failed   Social History   Socioeconomic History  . Marital status: Married    Spouse name: Not on file  . Number of children: 2  . Years of education: Not on file  . Highest education level: Not on file  Occupational History  . Not on file  Social Needs  . Financial resource strain: Not on file  . Food insecurity    Worry: Not on file    Inability: Not on file  . Transportation needs    Medical: Not on file    Non-medical: Not on file  Tobacco Use  . Smoking status: Never Smoker  . Smokeless tobacco: Never Used  Substance and Sexual Activity  . Alcohol use: Yes  . Drug use: No  . Sexual activity: Not on file  Lifestyle  . Physical activity    Days per week: Not on file    Minutes per session: Not on file  . Stress: Not on file  Relationships  . Social Herbalist on phone: Not on file    Gets together: Not on file    Attends religious service: Not on file    Active member of club or organization: Not on file    Attends meetings of clubs or organizations: Not on file    Relationship status: Not on file  Other Topics Concern  . Not on file  Social History Narrative  . Not on file    Family History  Problem Relation Age of Onset  . Hodgkin's lymphoma Father   . Cancer Father   . ALS Mother   . Hypertension Brother   . Heart attack Unknown    No Known Allergies Prior to Admission medications   Medication Sig Start Date End Date Taking? Authorizing Provider  aspirin 81 MG chewable tablet Chew 81 mg by mouth daily.   Yes [provider]  fluticasone (FLONASE) 50 MCG/ACT nasal spray Place into both nostrils daily.   Yes [provider]  olmesartan-hydrochlorothiazide (BENICAR HCT) 20-12.5 MG per tablet Take 0.5 tablets by mouth daily.    Yes [provider]  ondansetron (ZOFRAN ODT) 4 MG disintegrating tablet Take 1 tablet (4 mg total) by mouth every 8 (eight) hours as needed for nausea. 10/17/16  Yes Larene Pickett, PA-C  oxyCODONE-acetaminophen (PERCOCET/ROXICET) 5-325 MG tablet Take 1 tablet by mouth every 4 (four) hours as needed. 10/17/16  Yes Larene Pickett, PA-C     Positive ROS: Otherwise negative  All other systems have been reviewed and were  otherwise negative with the exception of those mentioned in the HPI and as above.  Physical Exam: Constitutional: Alert, well-appearing, no acute distress Ears: External ears without lesions or tenderness.  Right ear canal and right T-tube are clear and intact.  Left TM has a anterior perforation with minimal drainage presently.  Ear canal was cleaned with suction and Ciprodex eardrops were insufflated into the middle ear space. Nasal: External nose without lesions. Septum relatively midline. Clear nasal passages Oral: Lips and gums without lesions. Tongue and palate mucosa without lesions. Posterior oropharynx clear. Neck: No palpable adenopathy or masses Respiratory: Breathing comfortably  Skin: No facial/neck lesions or rash noted.  Procedures  Assessment: Left acute otitis media Left TM perforation  Plan: He will continue with antibiotic eardrops twice a day for another 3 to 4  days. Also placed him on Ceftin 5 mg twice daily for 1 week. He will notify us if he has any persistent drainage next week. He inquired about placing a tube in the left ear again but would wait until the perforation site heals prior to placing a tube in the ear.  If the tube is placed consider placement of a T-tube.   Radene Journey, MD

## 2019-11-19 ENCOUNTER — Telehealth (INDEPENDENT_AMBULATORY_CARE_PROVIDER_SITE_OTHER): Payer: Self-pay | Admitting: Otolaryngology

## 2019-11-19 ENCOUNTER — Other Ambulatory Visit (INDEPENDENT_AMBULATORY_CARE_PROVIDER_SITE_OTHER): Payer: Self-pay | Admitting: Otolaryngology

## 2019-11-19 MED ORDER — CIPROFLOXACIN HCL 500 MG PO TABS: 500.0000 mg | ORAL_TABLET | Freq: Two times a day (BID) | ORAL | 0 refills | Status: DC

## 2019-12-15 DIAGNOSIS — L308 Other specified dermatitis: Secondary | ICD-10-CM | POA: Diagnosis not present

## 2019-12-15 DIAGNOSIS — D485 Neoplasm of uncertain behavior of skin: Secondary | ICD-10-CM | POA: Diagnosis not present

## 2019-12-15 DIAGNOSIS — R238 Other skin changes: Secondary | ICD-10-CM | POA: Diagnosis not present

## 2020-01-05 DIAGNOSIS — B029 Zoster without complications: Secondary | ICD-10-CM | POA: Diagnosis not present

## 2020-01-20 DIAGNOSIS — B029 Zoster without complications: Secondary | ICD-10-CM | POA: Diagnosis not present

## 2020-02-05 DIAGNOSIS — B029 Zoster without complications: Secondary | ICD-10-CM | POA: Diagnosis not present

## 2020-03-05 DIAGNOSIS — R972 Elevated prostate specific antigen [PSA]: Secondary | ICD-10-CM | POA: Diagnosis not present

## 2020-03-05 DIAGNOSIS — R351 Nocturia: Secondary | ICD-10-CM | POA: Diagnosis not present

## 2020-03-08 ENCOUNTER — Other Ambulatory Visit: Payer: Self-pay | Admitting: Urology

## 2020-03-08 DIAGNOSIS — R972 Elevated prostate specific antigen [PSA]: Secondary | ICD-10-CM

## 2020-03-18 ENCOUNTER — Encounter (INDEPENDENT_AMBULATORY_CARE_PROVIDER_SITE_OTHER): Payer: Self-pay

## 2020-03-18 NOTE — Progress Notes (Unsigned)
Patient called in wanting medication for ear infection while on vacation at [facility]. I called in, per Dr. Lucia Gaskins, Cipro 500 mg, BID x 10 days. No refills. Called into [facility] [phone]. PM

## 2020-03-26 ENCOUNTER — Ambulatory Visit
Admission: RE | Admit: 2020-03-26 | Discharge: 2020-03-26 | Disposition: A | Discharge status: Home / Self Care | Payer: Medicare Other | Source: Ambulatory Visit | Attending: Urology | Admitting: Urology

## 2020-03-26 DIAGNOSIS — R972 Elevated prostate specific antigen [PSA]: Secondary | ICD-10-CM

## 2020-03-26 MED ORDER — GADOBENATE DIMEGLUMINE 529 MG/ML IV SOLN
20.0000 mL | Freq: Once | INTRAVENOUS | Status: AC | PRN
  Administered 2020-03-26: 20 mL via INTRAVENOUS

## 2020-03-31 ENCOUNTER — Other Ambulatory Visit: Payer: Self-pay

## 2020-03-31 ENCOUNTER — Ambulatory Visit (INDEPENDENT_AMBULATORY_CARE_PROVIDER_SITE_OTHER): Payer: Medicare Other | Admitting: Otolaryngology

## 2020-03-31 ENCOUNTER — Encounter (INDEPENDENT_AMBULATORY_CARE_PROVIDER_SITE_OTHER): Payer: Self-pay | Admitting: Otolaryngology

## 2020-03-31 VITALS — Temp 97.7°F

## 2020-03-31 DIAGNOSIS — H7293 Unspecified perforation of tympanic membrane, bilateral: Secondary | ICD-10-CM | POA: Diagnosis not present

## 2020-03-31 DIAGNOSIS — H6983 Other specified disorders of Eustachian tube, bilateral: Secondary | ICD-10-CM | POA: Diagnosis not present

## 2020-03-31 DIAGNOSIS — H6613 Chronic tubotympanic suppurative otitis media, bilateral: Secondary | ICD-10-CM | POA: Diagnosis not present

## 2020-03-31 NOTE — Progress Notes (Signed)
HPI: Jeremy Casey is a 69 y.o. male who returns today for evaluation of recurrent drainage from his ears.  More recently on the left side.  He has had intermittent drainage which is on and off.  He had previous T tubes placed several years ago.  On previous examination 4 months ago the left T-tube had extruded.  He had a persistent small left TM perforation.  The right T-tube is still intact at that point.  Past Medical History:  Diagnosis Date  . Crohn's disease ([place])   . Elevated PSA   . Essential hypertension, benign    [facility]   Past Surgical History:  Procedure Laterality Date  . HIP SURGERY    . KNEE SURGERY    . MOUTH SURGERY  2014   repeat from root canal, 2 teeth pulled  . PE tube placement  2002   secondary to serous otitis  . ROOT CANAL  2013   failed   Social History   Socioeconomic History  . Marital status: Married    Spouse name: Not on file  . Number of children: 2  . Years of education: Not on file  . Highest education level: Not on file  Occupational History  . Not on file  Tobacco Use  . Smoking status: Never Smoker  . Smokeless tobacco: Never Used  Substance and Sexual Activity  . Alcohol use: Yes  . Drug use: No  . Sexual activity: Not on file  Other Topics Concern  . Not on file  Social History Narrative  . Not on file   Social Determinants of Health   Financial Resource Strain:   . Difficulty of Paying Living Expenses:   Food Insecurity:   . Worried About Charity fundraiser in the Last Year:   . Arboriculturist in the Last Year:   Transportation Needs:   . Film/video editor (Medical):   Marland Kitchen Lack of Transportation (Non-Medical):   Physical Activity:   . Days of Exercise per Week:   . Minutes of Exercise per Session:   Stress:   . Feeling of Stress :   Social Connections:   . Frequency of Communication with Friends and Family:   . Frequency of Social Gatherings with Friends and Family:   . Attends Religious Services:   . Active  Member of Clubs or Organizations:   . Attends Archivist Meetings:   Marland Kitchen Marital Status:    Family History  Problem Relation Age of Onset  . Hodgkin's lymphoma Father   . Cancer Father   . ALS Mother   . Hypertension Brother   . Heart attack Unknown    No Known Allergies Prior to Admission medications   Medication Sig Start Date End Date Taking? Authorizing Provider  aspirin 81 MG chewable tablet Chew 81 mg by mouth daily.   Yes [provider]  ciprofloxacin (CIPRO) 500 MG tablet Take 1 tablet (500 mg total) by mouth 2 (two) times daily. 11/19/19  Yes Rozetta Nunnery, MD  fluticasone (FLONASE) 50 MCG/ACT nasal spray Place into both nostrils daily.   Yes [provider]  olmesartan-hydrochlorothiazide (BENICAR HCT) 20-12.5 MG per tablet Take 0.5 tablets by mouth daily.    Yes [provider]  ondansetron (ZOFRAN ODT) 4 MG disintegrating tablet Take 1 tablet (4 mg total) by mouth every 8 (eight) hours as needed for nausea. 10/17/16  Yes Larene Pickett, PA-C  oxyCODONE-acetaminophen (PERCOCET/ROXICET) 5-325 MG tablet Take 1 tablet by  mouth every 4 (four) hours as needed. 10/17/16  Yes Larene Pickett, PA-C     Positive ROS: Otherwise negative  All other systems have been reviewed and were otherwise negative with the exception of those mentioned in the HPI and as above.  Physical Exam: Constitutional: Alert, well-appearing, no acute distress Ears: External ears without lesions or tenderness.  On microscopic exam in the office today there is no active drainage noted on either side.  The right T-tube has extruded lying within the ear canal and this was removed.  He has a moderate size right central TM perforation but no active drainage noted.  The left ear reveals a smaller anterior inferior TM perforation again no active drainage noted.  I applied CSF powder to both ears. Nasal: External nose without lesions. Septum midline with mild rhinitis.. Clear  nasal passages Oral: Lips and gums without lesions. Tongue and palate mucosa without lesions. Posterior oropharynx clear. Neck: No palpable adenopathy or masses Respiratory: Breathing comfortably  Skin: No facial/neck lesions or rash noted.  Procedures  Assessment: Bilateral TM perforations with chronic eustachian tube dysfunction and intermittent ear drainage.  Plan: Reviewed with patient concerning keeping water and liquids out of the ears. I refilled his ofloxacin eardrop prescription as this has been beneficial in the past when he has drainage. He will continue with use of nasal steroid spray every night to help with eustachian tube dysfunction. He will keep the ears dry. He will follow-up if he has any persistent drainage that persists beyond 1 week of antibiotic eardrop use for culture if needed. He will follow-up in 6 months for recheck.   Radene Journey, MD

## 2020-04-15 DIAGNOSIS — R351 Nocturia: Secondary | ICD-10-CM | POA: Diagnosis not present

## 2020-04-15 DIAGNOSIS — R972 Elevated prostate specific antigen [PSA]: Secondary | ICD-10-CM | POA: Diagnosis not present

## 2020-04-21 ENCOUNTER — Other Ambulatory Visit: Payer: Self-pay

## 2020-04-21 DIAGNOSIS — R972 Elevated prostate specific antigen [PSA]: Secondary | ICD-10-CM

## 2020-04-29 DIAGNOSIS — Z85828 Personal history of other malignant neoplasm of skin: Secondary | ICD-10-CM | POA: Diagnosis not present

## 2020-04-29 DIAGNOSIS — L570 Actinic keratosis: Secondary | ICD-10-CM | POA: Diagnosis not present

## 2020-04-29 DIAGNOSIS — L814 Other melanin hyperpigmentation: Secondary | ICD-10-CM | POA: Diagnosis not present

## 2020-04-29 DIAGNOSIS — L718 Other rosacea: Secondary | ICD-10-CM | POA: Diagnosis not present

## 2020-04-29 DIAGNOSIS — D1801 Hemangioma of skin and subcutaneous tissue: Secondary | ICD-10-CM | POA: Diagnosis not present

## 2020-04-29 DIAGNOSIS — L820 Inflamed seborrheic keratosis: Secondary | ICD-10-CM | POA: Diagnosis not present

## 2020-04-29 DIAGNOSIS — L578 Other skin changes due to chronic exposure to nonionizing radiation: Secondary | ICD-10-CM | POA: Diagnosis not present

## 2020-04-29 DIAGNOSIS — L905 Scar conditions and fibrosis of skin: Secondary | ICD-10-CM | POA: Diagnosis not present

## 2020-04-29 DIAGNOSIS — D225 Melanocytic nevi of trunk: Secondary | ICD-10-CM | POA: Diagnosis not present

## 2020-04-29 DIAGNOSIS — L821 Other seborrheic keratosis: Secondary | ICD-10-CM | POA: Diagnosis not present

## 2020-05-25 DIAGNOSIS — J309 Allergic rhinitis, unspecified: Secondary | ICD-10-CM | POA: Diagnosis not present

## 2020-05-25 DIAGNOSIS — N401 Enlarged prostate with lower urinary tract symptoms: Secondary | ICD-10-CM | POA: Diagnosis not present

## 2020-05-25 DIAGNOSIS — M25561 Pain in right knee: Secondary | ICD-10-CM | POA: Diagnosis not present

## 2020-05-25 DIAGNOSIS — Z1389 Encounter for screening for other disorder: Secondary | ICD-10-CM | POA: Diagnosis not present

## 2020-05-25 DIAGNOSIS — Z136 Encounter for screening for cardiovascular disorders: Secondary | ICD-10-CM | POA: Diagnosis not present

## 2020-05-25 DIAGNOSIS — I10 Essential (primary) hypertension: Secondary | ICD-10-CM | POA: Diagnosis not present

## 2020-05-25 DIAGNOSIS — Z0000 Encounter for general adult medical examination without abnormal findings: Secondary | ICD-10-CM | POA: Diagnosis not present

## 2020-07-01 ENCOUNTER — Ambulatory Visit (INDEPENDENT_AMBULATORY_CARE_PROVIDER_SITE_OTHER): Payer: Medicare Other | Admitting: Otolaryngology

## 2020-07-01 ENCOUNTER — Other Ambulatory Visit: Payer: Self-pay

## 2020-07-01 DIAGNOSIS — H6123 Impacted cerumen, bilateral: Secondary | ICD-10-CM

## 2020-07-01 NOTE — Progress Notes (Signed)
HPI: Jeremy Casey is a 69 y.o. male who presents for evaluation of wax buildup in his ears.  Jeremy Casey returns today for cleaning his ears.  He has had a previous right T-tube in the left Paparella type I tube because of fluid in the ears.  These have now extruded.  He notices slight decreased hearing which has been gradual..  Past Medical History:  Diagnosis Date  . Crohn's disease ([place])   . Elevated PSA   . Essential hypertension, benign    [facility]   Past Surgical History:  Procedure Laterality Date  . HIP SURGERY    . KNEE SURGERY    . MOUTH SURGERY  2014   repeat from root canal, 2 teeth pulled  . PE tube placement  2002   secondary to serous otitis  . ROOT CANAL  2013   failed   Social History   Socioeconomic History  . Marital status: Married    Spouse name: Not on file  . Number of children: 2  . Years of education: Not on file  . Highest education level: Not on file  Occupational History  . Not on file  Tobacco Use  . Smoking status: Never Smoker  . Smokeless tobacco: Never Used  Substance and Sexual Activity  . Alcohol use: Yes  . Drug use: No  . Sexual activity: Not on file  Other Topics Concern  . Not on file  Social History Narrative  . Not on file   Social Determinants of Health   Financial Resource Strain:   . Difficulty of Paying Living Expenses:   Food Insecurity:   . Worried About Charity fundraiser in the Last Year:   . Arboriculturist in the Last Year:   Transportation Needs:   . Film/video editor (Medical):   Marland Kitchen Lack of Transportation (Non-Medical):   Physical Activity:   . Days of Exercise per Week:   . Minutes of Exercise per Session:   Stress:   . Feeling of Stress :   Social Connections:   . Frequency of Communication with Friends and Family:   . Frequency of Social Gatherings with Friends and Family:   . Attends Religious Services:   . Active Member of Clubs or Organizations:   . Attends Archivist Meetings:   Marland Kitchen Marital  Status:    Family History  Problem Relation Age of Onset  . Hodgkin's lymphoma Father   . Cancer Father   . ALS Mother   . Hypertension Brother   . Heart attack Unknown    No Known Allergies Prior to Admission medications   Medication Sig Start Date End Date Taking? Authorizing Provider  aspirin 81 MG chewable tablet Chew 81 mg by mouth daily.    [provider]  ciprofloxacin (CIPRO) 500 MG tablet Take 1 tablet (500 mg total) by mouth 2 (two) times daily. 11/19/19   Rozetta Nunnery, MD  fluticasone (FLONASE) 50 MCG/ACT nasal spray Place into both nostrils daily.    [provider]  olmesartan-hydrochlorothiazide (BENICAR HCT) 20-12.5 MG per tablet Take 0.5 tablets by mouth daily.     [provider]  ondansetron (ZOFRAN ODT) 4 MG disintegrating tablet Take 1 tablet (4 mg total) by mouth every 8 (eight) hours as needed for nausea. 10/17/16   Larene Pickett, PA-C  oxyCODONE-acetaminophen (PERCOCET/ROXICET) 5-325 MG tablet Take 1 tablet by mouth every 4 (four) hours as needed. 10/17/16   Larene Pickett, PA-C  Positive ROS: Otherwise negative  All other systems have been reviewed and were otherwise negative with the exception of those mentioned in the HPI and as above.  Physical Exam: Constitutional: Alert, well-appearing, no acute distress Ears: External ears without lesions or tenderness. Ear canals more wax buildup on the right side compared to the left.  Both ear canals were cleaned.  He has a small anterior inferior left TM perforation and a slightly larger right TM perforation.  Both perforations are dry.  On tuning fork testing he heard about the same in both ears with the 1024 tuning fork.. Nasal: External nose without lesions. Clear nasal passages Oral: Oropharynx clear. Neck: No palpable adenopathy or masses Respiratory: Breathing comfortably  Skin: No facial/neck lesions or rash noted.  Cerumen impaction removal  Date/Time: 07/01/2020  5:12 PM Performed by: Rozetta Nunnery, MD Authorized by: Rozetta Nunnery, MD   Consent:    Consent obtained:  Verbal   Consent given by:  Patient   Risks discussed:  Pain and bleeding Procedure details:    Location:  L ear and R ear   Procedure type: curette, suction and forceps   Post-procedure details:    Inspection:  Canal normal   Hearing quality:  Improved   Patient tolerance of procedure:  Tolerated well, no immediate complications Comments:     Small anterior inferior left TM perforation and slightly larger right TM perforation    Assessment: Wax buildup in both ears.  Chronic eustachian tube dysfunction with bilateral TM perforations following placement of tubes. Sensorineural hearing loss.  Plan: He presents to discuss also hearing aids and discussed with him that if he is having trouble with his hearing would recommend proceeding with hearing aids as the TM perforations should not cause any problems although I would recommend keeping water out of the ears. He will follow-up as needed  Radene Journey, MD

## 2020-07-14 DIAGNOSIS — N289 Disorder of kidney and ureter, unspecified: Secondary | ICD-10-CM | POA: Diagnosis not present

## 2020-08-27 DIAGNOSIS — Z471 Aftercare following joint replacement surgery: Secondary | ICD-10-CM | POA: Diagnosis not present

## 2020-08-27 DIAGNOSIS — M79671 Pain in right foot: Secondary | ICD-10-CM | POA: Diagnosis not present

## 2020-08-27 DIAGNOSIS — Z9889 Other specified postprocedural states: Secondary | ICD-10-CM | POA: Diagnosis not present

## 2020-08-27 DIAGNOSIS — Z96651 Presence of right artificial knee joint: Secondary | ICD-10-CM | POA: Diagnosis not present

## 2020-08-30 DIAGNOSIS — H01001 Unspecified blepharitis right upper eyelid: Secondary | ICD-10-CM | POA: Diagnosis not present

## 2020-08-30 DIAGNOSIS — H1013 Acute atopic conjunctivitis, bilateral: Secondary | ICD-10-CM | POA: Diagnosis not present

## 2020-09-14 DIAGNOSIS — Z23 Encounter for immunization: Secondary | ICD-10-CM | POA: Diagnosis not present

## 2020-10-06 ENCOUNTER — Other Ambulatory Visit: Payer: Medicare Other

## 2020-10-06 ENCOUNTER — Other Ambulatory Visit: Payer: Self-pay

## 2020-10-06 DIAGNOSIS — R972 Elevated prostate specific antigen [PSA]: Secondary | ICD-10-CM | POA: Diagnosis not present

## 2020-10-12 ENCOUNTER — Other Ambulatory Visit: Payer: Self-pay

## 2020-10-12 DIAGNOSIS — R972 Elevated prostate specific antigen [PSA]: Secondary | ICD-10-CM

## 2020-10-13 ENCOUNTER — Ambulatory Visit: Payer: Medicare Other | Admitting: Urology

## 2020-10-21 ENCOUNTER — Other Ambulatory Visit: Payer: Medicare Other

## 2020-10-22 DIAGNOSIS — M65312 Trigger thumb, left thumb: Secondary | ICD-10-CM | POA: Insufficient documentation

## 2020-10-25 DIAGNOSIS — H1013 Acute atopic conjunctivitis, bilateral: Secondary | ICD-10-CM | POA: Diagnosis not present

## 2020-10-27 ENCOUNTER — Encounter: Payer: Self-pay | Admitting: Urology

## 2020-10-27 ENCOUNTER — Other Ambulatory Visit: Payer: Self-pay

## 2020-10-27 ENCOUNTER — Ambulatory Visit (INDEPENDENT_AMBULATORY_CARE_PROVIDER_SITE_OTHER): Payer: Medicare Other | Admitting: Urology

## 2020-10-27 VITALS — BP 118/71 | HR 88 | Temp 98.7°F | Ht 67.0 in | Wt 198.0 lb

## 2020-10-27 DIAGNOSIS — R351 Nocturia: Secondary | ICD-10-CM | POA: Diagnosis not present

## 2020-10-27 DIAGNOSIS — R972 Elevated prostate specific antigen [PSA]: Secondary | ICD-10-CM

## 2020-10-27 LAB — URINALYSIS, ROUTINE W REFLEX MICROSCOPIC
Bilirubin, UA: NEGATIVE
Glucose, UA: NEGATIVE
Ketones, UA: NEGATIVE
Leukocytes,UA: NEGATIVE
Nitrite, UA: NEGATIVE
Protein,UA: NEGATIVE
Specific Gravity, UA: 1.025 (ref 1.005–1.030)
Urobilinogen, Ur: 0.2 mg/dL (ref 0.2–1.0)
pH, UA: 5.5 (ref 5.0–7.5)

## 2020-10-27 LAB — MICROSCOPIC EXAMINATION
Bacteria, UA: NONE SEEN
Epithelial Cells (non renal): NONE SEEN /hpf (ref 0–10)
Renal Epithel, UA: NONE SEEN /hpf
WBC, UA: NONE SEEN /hpf (ref 0–5)

## 2020-10-27 NOTE — Progress Notes (Signed)
10/27/2020 11:11 AM   Jeremy Casey [birth year] [id number]  Referring provider: Maury Dus, MD [address],  [place] [address]  followup elevated PSA and Nocturia  HPI: Mr Jeremy Casey is a [phone] here for followup for elevated PSA and nocturia. PSA 4.8. Prostate MRI from 6 months ago showed no evidence of prostate cancer. He continues to have nocturia 2x on fluid management. He is not bothered by the nocturia. Stream strong. No urgency or frequency.     His records from [facility] are as follows: My PSA is elevated above the normal range.  HPI: Jeremy Casey is a 69 year-old male established patient who is here for an elevated PSA.  His PSA is 4.9. He has had PSA's drawn prior to this one. He has had elevated PSA's prior to this one. He indicates there were no urinary problems when the PSA was drawn. He has not had a prostate nodule on a physical examination. He has not had recurrent prostate infections or chronic prostatitis.   He has not been on antibiotics for prostate infections previously. He has had a prostate biopsy done. His first prostate biopsy was done 03/05/2010. He does have the pathology report from his biopsy.   03/06/2019: He was previously seen by Dr. Gaynelle Arabian in 2010-2012 and had a prostate biopsy which was negative. His PSa was 2.5 in 2018, 3.7 in 2019 and 4.9 in 2020.   09/05/2019: PSA decreased to 4.04. LUTS are stable. He tried flomax since last visit but stopped it due to hypotension.   03/05/2020: PSA increased to 5.7. No new LUTS   04/15/2020: Prostate MRI shows no evidence of macroscopic prostate cancer. No worsening LUTS     CC: I get up too often at night to urinate.  HPI: He first noticed the symptom approximately 02/08/2013. He usually gets up at night to urinate 2 times. He does not have nights when he does not get up to urinate at all. He does not have trouble falling back asleep once he has been woken up at night.   He does not usually have  swelling in his hands and feet during the day. He does not take a diuretic. He does not have to strain or bear down to start his urinary stream.   03/06/2019: The patient has had nocturia 1-2x for 5-6 years but the nocturia has worsened to 3-4x in the past month   09/05/2019: Nocturia improved to 2x with fluid management   03/05/2020: nocturia stable on fluid management    04/15/2020: Nocturia is 2x and stream is weaker at night.     AUA Symptom Score: He never has to urinate again less that two hours after he has finished urinating. Less than 20% of the time he has to start and stop again several times when he urinates. Less than 20% of the time he finds it difficult to postpone urination. He never has a weak urinary stream. He never has to push or strain to begin urination. He has to get up to urinate 1 time from the time he goes to bed until the time he gets up in the morning.     QOL Score: He would feel pleased if he had to live with his urinary condition the way it is now for the rest of his life.   Calculated QOL Symptom Score: 1     PMH: Past Medical History:  Diagnosis Date  . Crohn's disease ([place])   . Elevated PSA   .  Essential hypertension, benign    [facility]    Surgical History: Past Surgical History:  Procedure Laterality Date  . HIP SURGERY    . KNEE SURGERY    . MOUTH SURGERY  2014   repeat from root canal, 2 teeth pulled  . PE tube placement  2002   secondary to serous otitis  . ROOT CANAL  2013   failed    Home Medications:  Allergies as of 10/27/2020      Reactions   Tamsulosin Hcl Other (See Comments)   Amoxicillin Other (See Comments)      Medication List       Accurate as of October 27, 2020 11:11 AM. If you have any questions, ask your nurse or doctor.        STOP taking these medications   ciprofloxacin 500 MG tablet Commonly known as: Cipro Stopped by: Nicolette Bang, MD   ondansetron 4 MG disintegrating tablet Commonly known as: Zofran  ODT Stopped by: Nicolette Bang, MD   oxyCODONE-acetaminophen 5-325 MG tablet Commonly known as: PERCOCET/ROXICET Stopped by: Nicolette Bang, MD     TAKE these medications   aspirin 81 MG chewable tablet 1 tablet What changed: Another medication with the same name was removed. Continue taking this medication, and follow the directions you see here. Changed by: Nicolette Bang, MD   Breo Ellipta [phone] MCG/INH Aepb Generic drug: fluticasone furoate-vilanterol 1 puff   fluticasone 0.05 % cream Commonly known as: CUTIVATE fluticasone propionate 0.05 % topical cream  APPLY TOPICALLY TO FACE TWICE A DAY   fluticasone 50 MCG/ACT nasal spray Commonly known as: FLONASE Place into both nostrils daily.   olmesartan-hydrochlorothiazide 20-12.5 MG tablet Commonly known as: BENICAR HCT Take 0.5 tablets by mouth daily.       Allergies:  Allergies  Allergen Reactions  . Tamsulosin Hcl Other (See Comments)  . Amoxicillin Other (See Comments)    Family History: Family History  Problem Relation Age of Onset  . Hodgkin's lymphoma Father   . Cancer Father   . ALS Mother   . Hypertension Brother   . Heart attack Unknown     Social History:  reports that he has never smoked. He has never used smokeless tobacco. He reports current alcohol use. He reports that he does not use drugs.  ROS: All other review of systems were reviewed and are negative except what is noted above in HPI  Physical Exam: BP 118/71   Pulse 88   Temp 98.7 F (37.1 C)   Ht 5\' 7"  (1.702 m)   Wt 198 lb (89.8 kg)   BMI 31.01 kg/m   Constitutional:  Alert and oriented, No acute distress. HEENT: [place] AT, moist mucus membranes.  Trachea midline, no masses. Cardiovascular: No clubbing, cyanosis, or edema. Respiratory: Normal respiratory effort, no increased work of breathing. GI: Abdomen is soft, nontender, nondistended, no abdominal masses GU: No CVA tenderness.  Lymph: No cervical or inguinal  lymphadenopathy. Skin: No rashes, bruises or suspicious lesions. Neurologic: Grossly intact, no focal deficits, moving all 4 extremities. Psychiatric: Normal mood and affect.  Laboratory Data: Lab Results  Component Value Date   WBC 12.2 (H) 10/16/2016   HGB 13.8 10/16/2016   HCT 41.2 10/16/2016   MCV 90.4 10/16/2016   PLT 195 10/16/2016    Lab Results  Component Value Date   CREATININE 1.14 10/16/2016    No results found for: PSA  No results found for: TESTOSTERONE  No results found for: HGBA1C  Urinalysis  Component Value Date/Time   COLORURINE YELLOW 10/16/2016 2320   APPEARANCEUR CLEAR 10/16/2016 2320   LABSPEC 1.010 10/16/2016 2320   PHURINE 5.5 10/16/2016 2320   GLUCOSEU NEGATIVE 10/16/2016 2320   HGBUR TRACE (A) 10/16/2016 2320   BILIRUBINUR NEGATIVE 10/16/2016 2320   KETONESUR 40 (A) 10/16/2016 2320   PROTEINUR NEGATIVE 10/16/2016 2320   NITRITE NEGATIVE 10/16/2016 2320   LEUKOCYTESUR NEGATIVE 10/16/2016 2320    Lab Results  Component Value Date   BACTERIA RARE (A) 10/16/2016    Pertinent Imaging:  No results found for this or any previous visit.  No results found for this or any previous visit.  No results found for this or any previous visit.  No results found for this or any previous visit.  No results found for this or any previous visit.  No results found for this or any previous visit.  No results found for this or any previous visit.  No results found for this or any previous visit.   Assessment & Plan:    1. Elevated prostate specific antigen (PSA) -RTC 6 months with PSA and DRE  2. Nocturia Continue fluid management   No follow-ups on file.  Nicolette Bang, MD  [facility] [place]

## 2020-10-27 NOTE — Addendum Note (Signed)
Addended by: Valentina Lucks on: 10/27/2020 03:55 PM   Modules accepted: Orders

## 2020-10-27 NOTE — Progress Notes (Signed)

## 2020-10-27 NOTE — Patient Instructions (Signed)
Prostate-Specific Antigen Test Why am I having this test? The prostate-specific antigen (PSA) test is a screening test for prostate cancer. It can identify early signs of prostate cancer, which may allow for more effective treatment. Your health care provider may recommend that you have a PSA test starting at age 68 or that you have one earlier or later, depending on your risk factors for prostate cancer. You may also have a PSA test:  To monitor treatment of prostate cancer.  To check whether prostate cancer has returned after treatment.  If you have signs of other conditions that can affect PSA levels, such as: ? An enlarged prostate that is not caused by cancer (benign prostatic hyperplasia, BPH). This condition is very common in older men. ? A prostate infection. What is being tested? This test measures the amount of PSA in your blood. PSA is a protein that is made in the prostate. The prostate naturally produces more PSA as you age, but very high levels may be a sign of a medical condition. What kind of sample is taken?  A blood sample is required for this test. It is usually collected by inserting a needle into a blood vessel or by sticking a finger with a small needle. Blood for this test should be drawn before having an exam of the prostate. How do I prepare for this test? Do not ejaculate starting 24 hours before your test, or as long as told by your health care provider. Tell a health care provider about:  Any allergies you have.  All medicines you are taking, including vitamins, herbs, eye drops, creams, and over-the-counter medicines. This also includes: ? Medicines to assist with hair growth, such as finasteride. ? Any recent exposure to a medicine called diethylstilbestrol.  Any blood disorders you have.  Any recent procedures you have had, especially any procedures involving the prostate or rectum.  Any medical conditions you have.  Any recent urinary tract infections  (UTIs) you have had. How are the results reported? Your test results will be reported as a value that indicates how much PSA is in your blood. This will be given as nanograms of PSA per milliliter of blood (ng/mL). Your health care provider will compare your results to normal ranges that were established after testing a large group of people (reference ranges). Reference ranges may vary among labs and hospitals. PSA levels vary from person to person and generally increase with age. Because of this variation, there is no single PSA value that is considered normal for everyone. Instead, PSA reference ranges are used to describe whether your PSA levels are considered low or high (elevated). Common reference ranges are:  Low: 0-2.5 ng/mL.  Slightly to moderately elevated: 2.6-10.0 ng/mL.  Moderately elevated: 10.0-19.9 ng/mL.  Significantly elevated: 20 ng/mL or greater. Sometimes, the test results may report that a condition is present when it is not present (false-positive result). What do the results mean? A test result that is higher than 4 ng/mL may mean that you are at an increased risk for prostate cancer. However, a PSA test by itself is not enough to diagnose prostate cancer. High PSA levels may also be caused by the natural aging process, prostate infection, or BPH. PSA screening cannot tell you if your PSA is high due to cancer or a different cause. A prostate biopsy is the only way to diagnose prostate cancer. A risk of having the PSA test is diagnosing and treating prostate cancer that would never have caused any   symptoms or problems (overdiagnosis and overtreatment). Talk with your health care provider about what your results mean. Questions to ask your health care provider Ask your health care provider, or the department that is doing the test:  When will my results be ready?  How will I get my results?  What are my treatment options?  What other tests do I need?  What are my  next steps? Summary  The prostate-specific antigen (PSA) test is a screening test for prostate cancer.  Your health care provider may recommend that you have a PSA test starting at age 68 or that you have one earlier or later, depending on your risk factors for prostate cancer.  A test result that is higher than 4 ng/mL may mean that you are at an increased risk for prostate cancer. However, elevated levels can be caused by a number of conditions other than prostate cancer.  Talk with your health care provider about what your results mean. This information is not intended to replace advice given to you by your health care provider. Make sure you discuss any questions you have with your health care provider. Document Revised: 11/09/2017 Document Reviewed: 09/03/2017 Elsevier Patient Education  2020 Elsevier Inc.  

## 2020-11-01 DIAGNOSIS — L820 Inflamed seborrheic keratosis: Secondary | ICD-10-CM | POA: Diagnosis not present

## 2020-11-01 DIAGNOSIS — L570 Actinic keratosis: Secondary | ICD-10-CM | POA: Diagnosis not present

## 2020-11-01 DIAGNOSIS — L579 Skin changes due to chronic exposure to nonionizing radiation, unspecified: Secondary | ICD-10-CM | POA: Diagnosis not present

## 2020-11-09 ENCOUNTER — Ambulatory Visit: Payer: Medicare Other | Attending: Internal Medicine

## 2020-11-09 ENCOUNTER — Other Ambulatory Visit (HOSPITAL_BASED_OUTPATIENT_CLINIC_OR_DEPARTMENT_OTHER): Payer: Self-pay | Admitting: Internal Medicine

## 2020-11-09 DIAGNOSIS — Z23 Encounter for immunization: Secondary | ICD-10-CM

## 2020-11-09 MED FILL — MODERNA COVID-19 VACCINE 10: 100 | 1 days supply | Qty: 0 | Fill #0

## 2020-11-09 NOTE — Progress Notes (Signed)
   Covid-19 Vaccination Clinic  Name:  MONTERRIUS CARDOSA    MRN: [id number] DOB: [birth year]  11/09/2020  Mr. Stoudt was observed post Covid-19 immunization for 15 minutes without incident. He was provided with Vaccine Information Sheet and instruction to access the V-Safe system.   Mr. Borgerding was instructed to call 911 with any severe reactions post vaccine: Marland Kitchen Difficulty breathing  . Swelling of face and throat  . A fast heartbeat  . A bad rash all over body  . Dizziness and weakness   Immunizations Administered    No immunizations on file.

## 2020-11-19 DIAGNOSIS — M65312 Trigger thumb, left thumb: Secondary | ICD-10-CM | POA: Diagnosis not present

## 2021-01-04 DIAGNOSIS — L570 Actinic keratosis: Secondary | ICD-10-CM | POA: Diagnosis not present

## 2021-01-25 DIAGNOSIS — M65312 Trigger thumb, left thumb: Secondary | ICD-10-CM | POA: Diagnosis not present

## 2021-02-14 DIAGNOSIS — L718 Other rosacea: Secondary | ICD-10-CM | POA: Diagnosis not present

## 2021-02-14 DIAGNOSIS — L810 Postinflammatory hyperpigmentation: Secondary | ICD-10-CM | POA: Diagnosis not present

## 2021-02-24 DIAGNOSIS — M65312 Trigger thumb, left thumb: Secondary | ICD-10-CM | POA: Diagnosis not present

## 2021-03-18 ENCOUNTER — Other Ambulatory Visit: Payer: Self-pay

## 2021-03-18 ENCOUNTER — Ambulatory Visit (INDEPENDENT_AMBULATORY_CARE_PROVIDER_SITE_OTHER): Payer: Medicare HMO | Admitting: Otolaryngology

## 2021-03-18 VITALS — Temp 97.9°F

## 2021-03-18 DIAGNOSIS — H72813 Multiple perforations of tympanic membrane, bilateral: Secondary | ICD-10-CM

## 2021-03-18 DIAGNOSIS — H6123 Impacted cerumen, bilateral: Secondary | ICD-10-CM | POA: Diagnosis not present

## 2021-03-18 DIAGNOSIS — H903 Sensorineural hearing loss, bilateral: Secondary | ICD-10-CM | POA: Diagnosis not present

## 2021-03-18 NOTE — Progress Notes (Signed)
HPI: Jeremy Casey is a 70 y.o. male who returns today for evaluation of his hearing.  He has noticed gradual worsening of his hearing.  He has had history of hearing loss as well as bilateral serous otitis media and required several sets of tubes in the past.  He has not been having any drainage from his ears.  He states that he is taken antihistamines on a regular basis and this seems to help with any ear infections. He also relates to me that he had a history of having steroids prescribed to him by Dr. Theda Sers for plantar fasciitis and shortly after taking the steroids he felt like his hearing improved dramatically for short while. He has had history of wax buildup in his ears..  Past Medical History:  Diagnosis Date  . Crohn's disease ([place])   . Elevated PSA   . Essential hypertension, benign    [facility]   Past Surgical History:  Procedure Laterality Date  . HIP SURGERY    . KNEE SURGERY    . MOUTH SURGERY  2014   repeat from root canal, 2 teeth pulled  . PE tube placement  2002   secondary to serous otitis  . ROOT CANAL  2013   failed   Social History   Socioeconomic History  . Marital status: Married    Spouse name: Not on file  . Number of children: 2  . Years of education: Not on file  . Highest education level: Not on file  Occupational History  . Not on file  Tobacco Use  . Smoking status: Never Smoker  . Smokeless tobacco: Never Used  Substance and Sexual Activity  . Alcohol use: Yes  . Drug use: No  . Sexual activity: Not on file  Other Topics Concern  . Not on file  Social History Narrative  . Not on file   Social Determinants of Health   Financial Resource Strain: Not on file  Food Insecurity: Not on file  Transportation Needs: Not on file  Physical Activity: Not on file  Stress: Not on file  Social Connections: Not on file   Family History  Problem Relation Age of Onset  . Hodgkin's lymphoma Father   . Cancer Father   . ALS Mother   . Hypertension  Brother   . Heart attack Unknown    Allergies  Allergen Reactions  . Tamsulosin Hcl Other (See Comments)  . Amoxicillin Other (See Comments)   Prior to Admission medications   Medication Sig Start Date End Date Taking? Authorizing Provider  aspirin 81 MG chewable tablet 1 tablet    [provider]  COVID-19 mRNA vaccine, Moderna, 100 MCG/0.5ML injection INJECT AS DIRECTED 11/09/20 11/09/21  Carlyle Basques, MD  fluticasone (CUTIVATE) 0.05 % cream fluticasone propionate 0.05 % topical cream  APPLY TOPICALLY TO FACE TWICE A DAY    [provider]  fluticasone (FLONASE) 50 MCG/ACT nasal spray Place into both nostrils daily.    [provider]  fluticasone furoate-vilanterol (BREO ELLIPTA) 200-25 MCG/INH AEPB 1 puff 05/07/19   [provider]  olmesartan-hydrochlorothiazide (BENICAR HCT) 20-12.5 MG per tablet Take 0.5 tablets by mouth daily.     [provider]     Positive ROS: Otherwise negative  All other systems have been reviewed and were otherwise negative with the exception of those mentioned in the HPI and as above.  Physical Exam: Constitutional: Alert, well-appearing, no acute distress Ears: External ears without lesions or tenderness. Ear canals with  a mild amount of wax in both ear canals that was cleaned with curettes and forceps.  After cleaning the ear canals the TMs were clear of note he had small anterior inferior TM perforations bilaterally the right side slightly larger than the left.  But these are dry with no middle ear abnormality noted.  On tuning fork testing Weber was midline.  AC was greater than BC bilaterally with the 512 fork.  He heard symmetrically with the 1024 tuning fork but had mild to moderate bilateral sensorineural hearing loss. Nasal: External nose without lesions. Septum relatively midline with mild rhinitis.. Clear nasal passages bilaterally. Oral: Lips and gums without lesions. Tongue and palate mucosa  without lesions. Posterior oropharynx clear. Neck: No palpable adenopathy or masses Respiratory: Breathing comfortably  Skin: No facial/neck lesions or rash noted.  Cerumen impaction removal  Date/Time: 03/18/2021 4:05 PM Performed by: Rozetta Nunnery, MD Authorized by: Rozetta Nunnery, MD   Consent:    Consent obtained:  Verbal   Consent given by:  Patient   Risks discussed:  Pain and bleeding Procedure details:    Location:  L ear and R ear   Procedure type: curette and forceps   Post-procedure details:    Inspection:  TM intact and canal normal   Hearing quality:  Improved   Patient tolerance of procedure:  Tolerated well, no immediate complications Comments:     TMs are clear bilaterally but he has small anterior inferior TM perforations in both TMs.    Assessment: Patient has mild to moderate bilateral sensorineural hearing loss. He also had moderate amount of wax buildup in both ear canals. He has bilateral TM perforations from previous placement of myringotomy tubes.  Plan: Would recommend obtaining a audiogram as his last hearing test was over a year and a half ago.  He would probably be a candidate for hearing aids and discussed this with him. Recommend keeping liquid or irrigations out of his ears as he has bilateral TM perforations. He heard better following cleaning the ears in the office today and will follow up here as needed for ear cleaning.   Radene Journey, MD

## 2021-04-22 ENCOUNTER — Other Ambulatory Visit: Payer: Self-pay

## 2021-04-22 ENCOUNTER — Other Ambulatory Visit: Payer: Medicare HMO

## 2021-04-22 DIAGNOSIS — R972 Elevated prostate specific antigen [PSA]: Secondary | ICD-10-CM | POA: Diagnosis not present

## 2021-04-23 LAB — PSA, TOTAL AND FREE
PSA, Free Pct: 45.6 %
PSA, Free: 1.55 ng/mL
Prostate Specific Ag, Serum: 3.4 ng/mL (ref 0.0–4.0)

## 2021-04-29 ENCOUNTER — Ambulatory Visit (INDEPENDENT_AMBULATORY_CARE_PROVIDER_SITE_OTHER): Payer: Medicare HMO | Admitting: Urology

## 2021-04-29 ENCOUNTER — Encounter: Payer: Self-pay | Admitting: Urology

## 2021-04-29 ENCOUNTER — Other Ambulatory Visit: Payer: Self-pay

## 2021-04-29 VITALS — BP 129/75 | HR 76 | Ht 67.0 in | Wt 193.0 lb

## 2021-04-29 DIAGNOSIS — R972 Elevated prostate specific antigen [PSA]: Secondary | ICD-10-CM | POA: Diagnosis not present

## 2021-04-29 DIAGNOSIS — R351 Nocturia: Secondary | ICD-10-CM | POA: Diagnosis not present

## 2021-04-29 LAB — MICROSCOPIC EXAMINATION
Bacteria, UA: NONE SEEN
Epithelial Cells (non renal): NONE SEEN /hpf (ref 0–10)
Renal Epithel, UA: NONE SEEN /hpf
WBC, UA: NONE SEEN /hpf (ref 0–5)

## 2021-04-29 LAB — URINALYSIS, ROUTINE W REFLEX MICROSCOPIC
Bilirubin, UA: NEGATIVE
Glucose, UA: NEGATIVE
Ketones, UA: NEGATIVE
Leukocytes,UA: NEGATIVE
Nitrite, UA: NEGATIVE
Protein,UA: NEGATIVE
Specific Gravity, UA: 1.02 (ref 1.005–1.030)
Urobilinogen, Ur: 0.2 mg/dL (ref 0.2–1.0)
pH, UA: 6 (ref 5.0–7.5)

## 2021-04-29 LAB — BLADDER SCAN AMB NON-IMAGING: Scan Result: 37

## 2021-04-29 NOTE — Progress Notes (Signed)
post void residual=37  Urological Symptom Review  Patient is experiencing the following symptoms: Frequent urination Get up at night to urinate Leakage of urine Stream starts and stops   Review of Systems  Gastrointestinal (upper)  : Negative for upper GI symptoms  Gastrointestinal (lower) : Negative for lower GI symptoms  Constitutional : Negative for symptoms  Skin: Negative for skin symptoms  Eyes: Negative for eye symptoms  Ear/Nose/Throat : Negative for Ear/Nose/Throat symptoms  Hematologic/Lymphatic: Negative for Hematologic/Lymphatic symptoms  Cardiovascular : Negative for cardiovascular symptoms  Respiratory : Negative for respiratory symptoms  Endocrine: Negative for endocrine symptoms  Musculoskeletal: Negative for musculoskeletal symptoms  Neurological: Negative for neurological symptoms  Psychologic: Negative for psychiatric symptoms

## 2021-04-29 NOTE — Patient Instructions (Signed)

## 2021-04-29 NOTE — Progress Notes (Signed)
04/29/2021 11:11 AM   Jeremy Casey 1951/01/17 [id number]  Referring provider: Maury Dus, MD [place],  [place] [address]  followup elevated PSA and nocturia  HPI: Mr Meter is a [phone] here for followup for elevated PSA and nocturia. IPSS 9 QOL 3. Nocturia 2x. PSA decreased to 3.4 from 4.8. He has worsening dribbling which does not bother hiom   PMH: Past Medical History:  Diagnosis Date  . Crohn's disease ([place])   . Elevated PSA   . Essential hypertension, benign    [facility]    Surgical History: Past Surgical History:  Procedure Laterality Date  . HIP SURGERY    . KNEE SURGERY    . MOUTH SURGERY  2014   repeat from root canal, 2 teeth pulled  . PE tube placement  2002   secondary to serous otitis  . ROOT CANAL  2013   failed    Home Medications:  Allergies as of 04/29/2021      Reactions   Tamsulosin Hcl Other (See Comments)   Amoxicillin Other (See Comments)      Medication List       Accurate as of Apr 29, 2021 11:11 AM. If you have any questions, ask your nurse or doctor.        aspirin 81 MG chewable tablet 1 tablet   Breo Ellipta 200-25 MCG/INH Aepb Generic drug: fluticasone furoate-vilanterol 1 puff   fluticasone 0.05 % cream Commonly known as: CUTIVATE fluticasone propionate 0.05 % topical cream  APPLY TOPICALLY TO FACE TWICE A DAY   fluticasone 50 MCG/ACT nasal spray Commonly known as: FLONASE Place into both nostrils daily.   Moderna COVID-19 Vaccine 100 MCG/0.5ML injection Generic drug: COVID-19 mRNA vaccine (Moderna) INJECT AS DIRECTED   olmesartan-hydrochlorothiazide 20-12.5 MG tablet Commonly known as: BENICAR HCT Take 0.5 tablets by mouth daily.       Allergies:  Allergies  Allergen Reactions  . Tamsulosin Hcl Other (See Comments)  . Amoxicillin Other (See Comments)    Family History: Family History  Problem Relation Age of Onset  . Hodgkin's lymphoma Father   . Cancer Father   . ALS  Mother   . Hypertension Brother   . Heart attack Unknown     Social History:  reports that he has never smoked. He has never used smokeless tobacco. He reports current alcohol use. He reports that he does not use drugs.  ROS: All other review of systems were reviewed and are negative except what is noted above in HPI  Physical Exam: BP 129/75   Pulse 76   Ht 5\' 7"  (1.702 m)   Wt 193 lb (87.5 kg)   BMI 30.23 kg/m   Constitutional:  Alert and oriented, No acute distress. HEENT: [place] AT, moist mucus membranes.  Trachea midline, no masses. Cardiovascular: No clubbing, cyanosis, or edema. Respiratory: Normal respiratory effort, no increased work of breathing. GI: Abdomen is soft, nontender, nondistended, no abdominal masses GU: No CVA tenderness. Prostate 60g smooth, no nodules. No induration Lymph: No cervical or inguinal lymphadenopathy. Skin: No rashes, bruises or suspicious lesions. Neurologic: Grossly intact, no focal deficits, moving all 4 extremities. Psychiatric: Normal mood and affect.  Laboratory Data: Lab Results  Component Value Date   WBC 12.2 (H) 10/16/2016   HGB 13.8 10/16/2016   HCT 41.2 10/16/2016   MCV 90.4 10/16/2016   PLT 195 10/16/2016    Lab Results  Component Value Date   CREATININE 1.14 10/16/2016  No results found for: PSA  No results found for: TESTOSTERONE  No results found for: HGBA1C  Urinalysis    Component Value Date/Time   COLORURINE YELLOW 10/16/2016 2320   APPEARANCEUR Clear 10/27/2020 1624   LABSPEC 1.010 10/16/2016 2320   PHURINE 5.5 10/16/2016 2320   GLUCOSEU Negative 10/27/2020 1624   HGBUR TRACE (A) 10/16/2016 2320   BILIRUBINUR Negative 10/27/2020 1624   KETONESUR 40 (A) 10/16/2016 2320   PROTEINUR Negative 10/27/2020 1624   PROTEINUR NEGATIVE 10/16/2016 2320   NITRITE Negative 10/27/2020 1624   NITRITE NEGATIVE 10/16/2016 2320   LEUKOCYTESUR Negative 10/27/2020 1624    Lab Results  Component Value Date   LABMICR  See below: 10/27/2020   WBCUA None seen 10/27/2020   LABEPIT None seen 10/27/2020   MUCUS Present 10/27/2020   BACTERIA None seen 10/27/2020    Pertinent Imaging:  No results found for this or any previous visit.  No results found for this or any previous visit.  No results found for this or any previous visit.  No results found for this or any previous visit.  No results found for this or any previous visit.  No results found for this or any previous visit.  No results found for this or any previous visit.  No results found for this or any previous visit.   Assessment & Plan:    1. Elevated prostate specific antigen (PSA) -RTC 6 months with PSA - Urinalysis, Routine w reflex microscopic  2. Nocturia -Patient defers therapy at this time - BLADDER SCAN AMB NON-IMAGING   Return in about 6 months (around 10/30/2021) for PSA.  Nicolette Bang, MD  [facility] [place]

## 2021-06-06 ENCOUNTER — Other Ambulatory Visit (INDEPENDENT_AMBULATORY_CARE_PROVIDER_SITE_OTHER): Payer: Self-pay | Admitting: Otolaryngology

## 2021-06-06 ENCOUNTER — Telehealth (INDEPENDENT_AMBULATORY_CARE_PROVIDER_SITE_OTHER): Payer: Self-pay | Admitting: Otolaryngology

## 2021-06-06 DIAGNOSIS — Z0000 Encounter for general adult medical examination without abnormal findings: Secondary | ICD-10-CM | POA: Diagnosis not present

## 2021-06-06 DIAGNOSIS — Z1389 Encounter for screening for other disorder: Secondary | ICD-10-CM | POA: Diagnosis not present

## 2021-06-06 DIAGNOSIS — N1831 Chronic kidney disease, stage 3a: Secondary | ICD-10-CM | POA: Diagnosis not present

## 2021-06-06 DIAGNOSIS — I129 Hypertensive chronic kidney disease with stage 1 through stage 4 chronic kidney disease, or unspecified chronic kidney disease: Secondary | ICD-10-CM | POA: Diagnosis not present

## 2021-06-06 DIAGNOSIS — J309 Allergic rhinitis, unspecified: Secondary | ICD-10-CM | POA: Diagnosis not present

## 2021-06-06 DIAGNOSIS — M25561 Pain in right knee: Secondary | ICD-10-CM | POA: Diagnosis not present

## 2021-06-06 DIAGNOSIS — N401 Enlarged prostate with lower urinary tract symptoms: Secondary | ICD-10-CM | POA: Diagnosis not present

## 2021-06-06 MED ORDER — OFLOXACIN 0.3 % OT SOLN: 5.0000 [drp] | Freq: Every day | OTIC | 0 refills | Status: AC

## 2021-06-06 NOTE — Telephone Encounter (Signed)
At patient's request I called and Floxin otic eardrops 4 to 5 drops twice daily x5 days as needed draining ear into his pharmacy at [facility] on [address].

## 2021-06-20 ENCOUNTER — Other Ambulatory Visit: Payer: Self-pay

## 2021-06-20 ENCOUNTER — Ambulatory Visit (INDEPENDENT_AMBULATORY_CARE_PROVIDER_SITE_OTHER): Payer: Medicare HMO | Admitting: Otolaryngology

## 2021-06-20 DIAGNOSIS — H7293 Unspecified perforation of tympanic membrane, bilateral: Secondary | ICD-10-CM

## 2021-06-20 DIAGNOSIS — H903 Sensorineural hearing loss, bilateral: Secondary | ICD-10-CM

## 2021-06-20 DIAGNOSIS — H6123 Impacted cerumen, bilateral: Secondary | ICD-10-CM | POA: Diagnosis not present

## 2021-06-20 NOTE — Progress Notes (Signed)
HPI: Jeremy Casey is a 70 y.o. male who returns today for evaluation of wax buildup in his ears.  He has noticed gradual progressive hearing loss..  Past Medical History:  Diagnosis Date   Crohn's disease ([place])    Elevated PSA    Essential hypertension, benign    [facility]   Past Surgical History:  Procedure Laterality Date   HIP SURGERY     KNEE SURGERY     MOUTH SURGERY  2014   repeat from root canal, 2 teeth pulled   PE tube placement  2002   secondary to serous otitis   ROOT CANAL  2013   failed   Social History   Socioeconomic History   Marital status: Married    Spouse name: Not on file   Number of children: 2   Years of education: Not on file   Highest education level: Not on file  Occupational History   Not on file  Tobacco Use   Smoking status: Never   Smokeless tobacco: Never  Substance and Sexual Activity   Alcohol use: Yes   Drug use: No   Sexual activity: Not on file  Other Topics Concern   Not on file  Social History Narrative   Not on file   Social Determinants of Health   Financial Resource Strain: Not on file  Food Insecurity: Not on file  Transportation Needs: Not on file  Physical Activity: Not on file  Stress: Not on file  Social Connections: Not on file   Family History  Problem Relation Age of Onset   Hodgkin's lymphoma Father    Cancer Father    ALS Mother    Hypertension Brother    Heart attack Unknown    Allergies  Allergen Reactions   Tamsulosin Hcl Other (See Comments)   Amoxicillin Other (See Comments)   Prior to Admission medications   Medication Sig Start Date End Date Taking? Authorizing Provider  aspirin 81 MG chewable tablet 1 tablet    [provider]  COVID-19 mRNA vaccine, Moderna, 100 MCG/0.5ML injection INJECT AS DIRECTED 11/09/20 11/09/21  Carlyle Basques, MD  fluticasone (CUTIVATE) 0.05 % cream fluticasone propionate 0.05 % topical cream  APPLY TOPICALLY TO FACE TWICE A DAY    [provider]   fluticasone (FLONASE) 50 MCG/ACT nasal spray Place into both nostrils daily.    [provider]  fluticasone furoate-vilanterol (BREO ELLIPTA) 200-25 MCG/INH AEPB 1 puff 05/07/19   [provider]  olmesartan-hydrochlorothiazide (BENICAR HCT) 20-12.5 MG per tablet Take 0.5 tablets by mouth daily.     [provider]     Positive ROS: Otherwise negative  All other systems have been reviewed and were otherwise negative with the exception of those mentioned in the HPI and as above.  Physical Exam: Constitutional: Alert, well-appearing, no acute distress Ears: External ears without lesions or tenderness. Ear canals with large amount of wax buildup in both ear canals that was cleaned with forceps and suction.  Of note he has bilateral TM perforations with a large perforation on the right side.  There is no drainage from either perforation. Nasal: External nose without lesions. . Clear nasal passages Oral: Lips and gums without lesions. Tongue and palate mucosa without lesions. Posterior oropharynx clear. Neck: No palpable adenopathy or masses Respiratory: Breathing comfortably  Skin: No facial/neck lesions or rash noted.  Cerumen impaction removal  Date/Time: 06/20/2021 5:49 PM Performed by: Rozetta Nunnery, MD Authorized by: Rozetta Nunnery, MD  Consent:    Consent obtained:  Verbal   Consent given by:  Patient   Risks discussed:  Pain and bleeding Procedure details:    Location:  L ear and R ear   Procedure type: suction and forceps   Post-procedure details:    Inspection:  TM intact and canal normal   Hearing quality:  Improved   Procedure completion:  Tolerated well, no immediate complications Comments:     Patient with bilateral TM perforations larger on the right side.  His last hearing test performed at in October 2020 demonstrated a moderate severe downsloping sensorineural hearing loss in both ears.  Assessment: Bilateral wax  buildup. Chronic bilateral TM perforations. Bilateral sensorineural hearing loss.  Plan: Would recommend obtaining hearing aids and discussed this with him.  He apparently cannot afford hearing aids at [facility] and suggested trying [facility] for audiologic testing and hearing aids.   Radene Journey, MD

## 2021-06-21 ENCOUNTER — Other Ambulatory Visit (INDEPENDENT_AMBULATORY_CARE_PROVIDER_SITE_OTHER): Payer: Self-pay | Admitting: Otolaryngology

## 2021-06-21 ENCOUNTER — Telehealth (INDEPENDENT_AMBULATORY_CARE_PROVIDER_SITE_OTHER): Payer: Self-pay | Admitting: Otolaryngology

## 2021-06-21 MED ORDER — CIPROFLOXACIN-DEXAMETHASONE 0.3-0.1 % OT SUSP: 4.0000 [drp] | Freq: Two times a day (BID) | OTIC | 0 refills | Status: DC

## 2021-06-21 NOTE — Telephone Encounter (Signed)
Clearence called today concerning persistent drainage and pain in the right ear.  Recommended starting the Ciprodex eardrops 4 drops twice daily x5 days or until drainage and pain subsides.  I sent a refill for Ciprodex sent to his [facility] at [facility].

## 2021-07-13 DIAGNOSIS — H90A22 Sensorineural hearing loss, unilateral, left ear, with restricted hearing on the contralateral side: Secondary | ICD-10-CM | POA: Diagnosis not present

## 2021-07-13 DIAGNOSIS — H90A31 Mixed conductive and sensorineural hearing loss, unilateral, right ear with restricted hearing on the contralateral side: Secondary | ICD-10-CM | POA: Diagnosis not present

## 2021-09-09 DIAGNOSIS — L814 Other melanin hyperpigmentation: Secondary | ICD-10-CM | POA: Diagnosis not present

## 2021-09-09 DIAGNOSIS — C4401 Basal cell carcinoma of skin of lip: Secondary | ICD-10-CM | POA: Diagnosis not present

## 2021-09-09 DIAGNOSIS — D225 Melanocytic nevi of trunk: Secondary | ICD-10-CM | POA: Diagnosis not present

## 2021-09-09 DIAGNOSIS — L821 Other seborrheic keratosis: Secondary | ICD-10-CM | POA: Diagnosis not present

## 2021-09-09 DIAGNOSIS — L570 Actinic keratosis: Secondary | ICD-10-CM | POA: Diagnosis not present

## 2021-09-09 DIAGNOSIS — D485 Neoplasm of uncertain behavior of skin: Secondary | ICD-10-CM | POA: Diagnosis not present

## 2021-09-09 DIAGNOSIS — Z08 Encounter for follow-up examination after completed treatment for malignant neoplasm: Secondary | ICD-10-CM | POA: Diagnosis not present

## 2021-09-09 DIAGNOSIS — Z85828 Personal history of other malignant neoplasm of skin: Secondary | ICD-10-CM | POA: Diagnosis not present

## 2021-10-28 ENCOUNTER — Other Ambulatory Visit: Payer: Self-pay | Admitting: Urology

## 2021-10-28 ENCOUNTER — Other Ambulatory Visit: Payer: Medicare HMO

## 2021-10-28 DIAGNOSIS — R972 Elevated prostate specific antigen [PSA]: Secondary | ICD-10-CM | POA: Diagnosis not present

## 2021-10-29 LAB — PSA: Prostate Specific Ag, Serum: 3.9 ng/mL (ref 0.0–4.0)

## 2021-11-07 ENCOUNTER — Other Ambulatory Visit: Payer: Self-pay

## 2021-11-07 ENCOUNTER — Ambulatory Visit: Payer: Medicare HMO | Admitting: Urology

## 2021-11-07 ENCOUNTER — Encounter: Payer: Self-pay | Admitting: Urology

## 2021-11-07 VITALS — BP 144/76 | HR 91 | Temp 98.5°F

## 2021-11-07 DIAGNOSIS — R972 Elevated prostate specific antigen [PSA]: Secondary | ICD-10-CM | POA: Diagnosis not present

## 2021-11-07 DIAGNOSIS — R351 Nocturia: Secondary | ICD-10-CM

## 2021-11-07 LAB — URINALYSIS, ROUTINE W REFLEX MICROSCOPIC
Bilirubin, UA: NEGATIVE
Glucose, UA: NEGATIVE
Ketones, UA: NEGATIVE
Leukocytes,UA: NEGATIVE
Nitrite, UA: NEGATIVE
Protein,UA: NEGATIVE
Specific Gravity, UA: 1.02 (ref 1.005–1.030)
Urobilinogen, Ur: 0.2 mg/dL (ref 0.2–1.0)
pH, UA: 5 (ref 5.0–7.5)

## 2021-11-07 LAB — MICROSCOPIC EXAMINATION
Bacteria, UA: NONE SEEN
Epithelial Cells (non renal): NONE SEEN /hpf (ref 0–10)
Renal Epithel, UA: NONE SEEN /hpf
WBC, UA: NONE SEEN /hpf (ref 0–5)

## 2021-11-07 NOTE — Progress Notes (Signed)
Urological Symptom Review  Patient is experiencing the following symptoms: Get up at night to urinate Leakage of urine   Review of Systems  Gastrointestinal (upper)  : Negative for upper GI symptoms  Gastrointestinal (lower) : Negative for lower GI symptoms  Constitutional : Negative for symptoms  Skin: Negative for skin symptoms  Eyes: Negative for eye symptoms  Ear/Nose/Throat : Negative for Ear/Nose/Throat symptoms  Hematologic/Lymphatic: Negative for Hematologic/Lymphatic symptoms  Cardiovascular : Negative for cardiovascular symptoms  Respiratory : Negative for respiratory symptoms  Endocrine: Negative for endocrine symptoms  Musculoskeletal: Negative for musculoskeletal symptoms  Neurological: Negative for neurological symptoms  Psychologic: Negative for psychiatric symptoms

## 2021-11-07 NOTE — Progress Notes (Signed)
11/07/2021 11:07 AM   Jeremy Casey 1951/02/09 [id number]  Referring provider: Maury Dus, MD [place],  [place] [address]  Followup elevated PSA  HPI: Mr Alvizo is a [phone] here for followup for elevated PSA and nocturia. PSA 3.9 which is stable. IPSS 8 QOL 2. Urine stream strong. NO other complaints today   PMH: Past Medical History:  Diagnosis Date   Crohn's disease ([place])    Elevated PSA    Essential hypertension, benign    [facility]    Surgical History: Past Surgical History:  Procedure Laterality Date   HIP SURGERY     KNEE SURGERY     MOUTH SURGERY  2014   repeat from root canal, 2 teeth pulled   PE tube placement  2002   secondary to serous otitis   ROOT CANAL  2013   failed    Home Medications:  Allergies as of 11/07/2021       Reactions   Tamsulosin Hcl Other (See Comments)   Amoxicillin Other (See Comments)        Medication List        Accurate as of November 07, 2021 11:07 AM. If you have any questions, ask your nurse or doctor.          aspirin 81 MG chewable tablet 1 tablet   Breo Ellipta 200-25 MCG/ACT Aepb Generic drug: fluticasone furoate-vilanterol 1 puff   ciprofloxacin-dexamethasone OTIC suspension Commonly known as: Ciprodex Place 4 drops into the right ear 2 (two) times daily.   fluticasone 0.05 % cream Commonly known as: CUTIVATE fluticasone propionate 0.05 % topical cream  APPLY TOPICALLY TO FACE TWICE A DAY   fluticasone 50 MCG/ACT nasal spray Commonly known as: FLONASE Place into both nostrils daily.   Moderna COVID-19 Vaccine 100 MCG/0.5ML injection Generic drug: COVID-19 mRNA vaccine (Moderna) INJECT AS DIRECTED   olmesartan-hydrochlorothiazide 20-12.5 MG tablet Commonly known as: BENICAR HCT Take 0.5 tablets by mouth daily.        Allergies:  Allergies  Allergen Reactions   Tamsulosin Hcl Other (See Comments)   Amoxicillin Other (See Comments)    Family  History: Family History  Problem Relation Age of Onset   Hodgkin's lymphoma Father    Cancer Father    ALS Mother    Hypertension Brother    Heart attack Unknown     Social History:  reports that he has never smoked. He has never used smokeless tobacco. He reports current alcohol use. He reports that he does not use drugs.  ROS: All other review of systems were reviewed and are negative except what is noted above in HPI  Physical Exam: BP (!) 144/76   Pulse 91   Temp 98.5 F (36.9 C)   Constitutional:  Alert and oriented, No acute distress. HEENT: [place] AT, moist mucus membranes.  Trachea midline, no masses. Cardiovascular: No clubbing, cyanosis, or edema. Respiratory: Normal respiratory effort, no increased work of breathing. GI: Abdomen is soft, nontender, nondistended, no abdominal masses GU: No CVA tenderness.  Lymph: No cervical or inguinal lymphadenopathy. Skin: No rashes, bruises or suspicious lesions. Neurologic: Grossly intact, no focal deficits, moving all 4 extremities. Psychiatric: Normal mood and affect.  Laboratory Data: Lab Results  Component Value Date   WBC 12.2 (H) 10/16/2016   HGB 13.8 10/16/2016   HCT 41.2 10/16/2016   MCV 90.4 10/16/2016   PLT 195 10/16/2016    Lab Results  Component Value Date   CREATININE 1.14 10/16/2016  No results found for: PSA  No results found for: TESTOSTERONE  No results found for: HGBA1C  Urinalysis    Component Value Date/Time   COLORURINE YELLOW 10/16/2016 2320   APPEARANCEUR Clear 04/29/2021 1050   LABSPEC 1.010 10/16/2016 2320   PHURINE 5.5 10/16/2016 2320   GLUCOSEU Negative 04/29/2021 1050   HGBUR TRACE (A) 10/16/2016 2320   BILIRUBINUR Negative 04/29/2021 1050   KETONESUR 40 (A) 10/16/2016 2320   PROTEINUR Negative 04/29/2021 1050   PROTEINUR NEGATIVE 10/16/2016 2320   NITRITE Negative 04/29/2021 1050   NITRITE NEGATIVE 10/16/2016 2320   LEUKOCYTESUR Negative 04/29/2021 1050    Lab Results   Component Value Date   LABMICR See below: 04/29/2021   WBCUA None seen 04/29/2021   LABEPIT None seen 04/29/2021   MUCUS Present 10/27/2020   BACTERIA None seen 04/29/2021    Pertinent Imaging:  No results found for this or any previous visit.  No results found for this or any previous visit.  No results found for this or any previous visit.  No results found for this or any previous visit.  No results found for this or any previous visit.  No results found for this or any previous visit.  No results found for this or any previous visit.  No results found for this or any previous visit.   Assessment & Plan:    1. Elevated prostate specific antigen (PSA) -RTC 1 year with PSA - Urinalysis, Routine w reflex microscopic  2. Nocturia -patient defers therapy at this time   No follow-ups on file.  Nicolette Bang, MD  [facility] [place]

## 2021-11-07 NOTE — Patient Instructions (Signed)

## 2021-11-15 ENCOUNTER — Encounter: Payer: Self-pay | Admitting: Urology

## 2021-11-15 DIAGNOSIS — H66002 Acute suppurative otitis media without spontaneous rupture of ear drum, left ear: Secondary | ICD-10-CM | POA: Diagnosis not present

## 2021-11-15 DIAGNOSIS — H6692 Otitis media, unspecified, left ear: Secondary | ICD-10-CM | POA: Diagnosis not present

## 2021-11-15 DIAGNOSIS — Z760 Encounter for issue of repeat prescription: Secondary | ICD-10-CM | POA: Diagnosis not present

## 2021-11-15 DIAGNOSIS — R0981 Nasal congestion: Secondary | ICD-10-CM | POA: Diagnosis not present

## 2021-11-18 DIAGNOSIS — C000 Malignant neoplasm of external upper lip: Secondary | ICD-10-CM | POA: Diagnosis not present

## 2021-11-18 DIAGNOSIS — C4491 Basal cell carcinoma of skin, unspecified: Secondary | ICD-10-CM | POA: Diagnosis not present

## 2021-11-18 DIAGNOSIS — Z481 Encounter for planned postprocedural wound closure: Secondary | ICD-10-CM | POA: Diagnosis not present

## 2021-12-07 NOTE — Progress Notes (Signed)
Results printed and mailed.   

## 2021-12-12 DIAGNOSIS — H66005 Acute suppurative otitis media without spontaneous rupture of ear drum, recurrent, left ear: Secondary | ICD-10-CM | POA: Diagnosis not present

## 2021-12-16 DIAGNOSIS — Z7689 Persons encountering health services in other specified circumstances: Secondary | ICD-10-CM | POA: Diagnosis not present

## 2021-12-23 DIAGNOSIS — H7293 Unspecified perforation of tympanic membrane, bilateral: Secondary | ICD-10-CM | POA: Diagnosis not present

## 2021-12-28 DIAGNOSIS — L570 Actinic keratosis: Secondary | ICD-10-CM | POA: Diagnosis not present

## 2021-12-28 DIAGNOSIS — L718 Other rosacea: Secondary | ICD-10-CM | POA: Diagnosis not present

## 2022-01-17 DIAGNOSIS — H6062 Unspecified chronic otitis externa, left ear: Secondary | ICD-10-CM | POA: Diagnosis not present

## 2022-01-24 NOTE — Progress Notes (Signed)
NEW PATIENT Date of Service/Encounter:  01/26/22 Referring provider: Maury Dus, MD Primary care provider: Maury Dus, MD  Subjective:  Jeremy Casey is a 71 y.o. male with a PMHx of hypertension, CKD, BPH, allergic cough presenting today for evaluation of chronic rhinitis. History obtained from: chart review and patient.  Chronic rhinitis: started years ago, has been present most of his life, but recently getting worse Previously he would use claritin and this would help prevent sinus infections, later added flonase but used mostly during Spring and Fall.  Over time, his symptoms became yearly and persistent, he then started switching between flonase and nasacort; over the past few years, continued to get sinus infections and occasionally giving rise to ear infections.   Was told by PCP that symptoms were caused by allergies. Past 5 years, things have gotten worse.  He did see Jeremy Casey (ENT) for fluid behind his ears and had tubes placed.  However, tubes fell out but his membrane did not close.  He then saw [facility] allergist 4 years ago, and allergy testing at that time was negative . Was told to continue OTC meds. Saw an ophthalmologist for watery eyes, was told issues were due to allergies and told to start antihistamines. He has started using a Netti pot every day.  When he goes to the beach, does not need to use it. He has been using an air purifier, had his air ducts cleaned.  He did see an immediate improvement with the use of the air purifier.  Was good until November when he developed sinus infection followed by ear infection.   He has had 3 ear infections since November.   Since his sinus infection, he has been seen again by [facility] and was told that ear did not appear infected.  Was instructed to continue ofloxacin ear drops but ear infection returned.  He has started requiring   He would like to explore the possibilities of allergies since he has been told by multiple  physicians that his symptoms are most likely due to allergies.  He has started having concerned about indoor plants as his wife who retired 5 years ago has added to her collection of indoor plants.  He wonders if this could be a trigger for his allergy like symptoms. Treatments tried: flonase, singulair, zyrtec, claritin and benadryl (alternating between AH). He did briefly stop Singulair and restarted and did not notice any significant difference with this medication.   He is getting around 2 rounds of oral antibiotics per year for sinus/ear infection, many more rounds of topical antibiotics for ear infections.  He denies previous history of pneumonia.  Has never required hospitalization for an infection.    Additionally he has a prescription for Breo but he says this was given to him by his PCP a while back for cough.  He denies asthma and states he never used this medication.  Past Medical History: Past Medical History:  Diagnosis Date   Crohn's disease ([place])    Elevated PSA    Essential hypertension, benign    [facility]   Medication List:  Current Outpatient Medications  Medication Sig Dispense Refill   aspirin 81 MG chewable tablet 1 tablet     fluticasone (CUTIVATE) 0.05 % cream fluticasone propionate 0.05 % topical cream  APPLY TOPICALLY TO FACE TWICE A DAY     fluticasone (FLONASE) 50 MCG/ACT nasal spray Place into both nostrils daily.     olmesartan-hydrochlorothiazide (BENICAR HCT) 20-12.5 MG per tablet  Take 0.5 tablets by mouth daily.      ciprofloxacin-dexamethasone (CIPRODEX) OTIC suspension Place 4 drops into the right ear 2 (two) times daily. (Patient not taking: Reported on 01/26/2022) 7.5 mL 0   fluticasone furoate-vilanterol (BREO ELLIPTA) 200-25 MCG/INH AEPB 1 puff (Patient not taking: Reported on 01/26/2022)     No current facility-administered medications for this visit.   Known Allergies:  Allergies  Allergen Reactions   Tamsulosin Hcl Other (See Comments)    Amoxicillin Other (See Comments)   Past Surgical History: Past Surgical History:  Procedure Laterality Date   [place] SURGERY  2014   repeat from root canal, 2 teeth pulled   PE tube placement  2002   secondary to serous otitis   ROOT CANAL  2013   failed   Family History: Family History  Problem Relation Age of Onset   Hodgkin's lymphoma Father    Cancer Father    ALS Mother    Hypertension Brother    Heart attack Unknown    Social History: [name] lives at home with his wife, wife is retired, home built 42 years ago, no water damage, carpet in the bedroom, heat pump heating and cooling, no pets, no cockroaches, not using dust mite protection in the house.  He is also retired.  He enjoys gardening, fishing, farm work.  + HEPA filter in the home.  [place]   ROS:  All other systems negative except as noted per HPI.  Objective:  Blood pressure 120/76, pulse 97, temperature 97.9 F (36.6 C), resp. rate 16, height 5\' 7"  (1.702 m), weight 207 lb 6.4 oz (94.1 kg), SpO2 96 %. Body mass index is 32.48 kg/m. Physical Exam:  General Appearance:  Alert, cooperative, no distress, appears stated age  Head:  Normocephalic, without obvious abnormality, atraumatic  Eyes:  Conjunctiva clear, EOM's intact  Nose: Nares normal, normal mucosa, no visible anterior polyps, and septum midline  Throat: Lips, tongue normal; teeth and gums normal, normal posterior oropharynx  Neck: Supple, symmetrical  Lungs:   clear to auscultation bilaterally, Respirations unlabored, no coughing  Heart:  regular rate and rhythm and no murmur, Appears well perfused  Extremities: No edema  Skin: Skin color, texture, turgor normal, no rashes or lesions on visualized portions of skin  Neurologic: No gross deficits     Diagnostics:  Skin Testing: Environmental allergy panel.  Adequate controls. Results discussed with patient/family.  Airborne  Adult Perc - 01/26/22 0920     Time Antigen Placed 0263    Allergen Manufacturer Lavella Hammock    Location Back    Number of Test 59             Allergy testing results were read and interpreted by myself, documented by clinical staff.  Assessment and Plan  Long conversation with patient regarding chronic rhinitis and all of its possible etiologies including allergic and nonallergic rhinitis.  Allergy testing on both skin and intradermal's today was negative.  This is consistent with a previous allergy test that he reportedly had around 4 years ago.  Discussed that typically at his age most people's environmental allergies symptoms tend to decrease over time.  Vasomotor rhinitis, a type of nonallergic rhinitis, does tend to become more prominent and I suspect this is playing a major role in his symptoms.  However, we will also obtain blood testing for environmental allergies to further explore the possibility of allergies  with him as he has been told by multiple physicians that allergies are the root cause of his problem.  Additionally we discussed that recurrent sinus and ear infections can be secondary to immune defects-particular concern with humoral immune defect.  We did order labs today to evaluate this.  However, suspect that his recurrent ear infections are due to the persistent membrane perforation in bilateral ears.  He clearly has uncontrolled upper airway inflammation.  He has not been controlled with previous regimen so we explored a few other options.  I also think it would be a good idea to obtain imaging of his sinuses  Patient Instructions  Chronic Rhinitis: - allergy testing today was negative (both skin test and intradermal testing); we will confirm with blood work - Continue Nasal Steroid Spray: Options include Flonase (fluticasone), Nasocort (triamcinolone), Nasonex (mometasome) 1- 2 sprays in each nostril daily (can buy over-the-counter if not covered by insurance)  Best results  if used daily. - Continue over the counter antihistamine daily or daily as needed.   -Your options include Zyrtec (Cetirizine) 10mg , Claritin (Loratadine) 10mg , Allegra (Fexofenadine) 180mg , or Xyzal (Levocetirinze) 5mg  - continue sinus rinses twice daily  -  Please ask you Urologist if they would be okay with you trying this medication:  Atrovent (Ipratropium Bromide) 1-2 sprays in each nostril up to 3 times a day as needed for runny nose/post nasal drip/drainage.  Use less frequently if airway gets too dry.  It may make urinary retention symptoms worse and if so would need to be discontinued.  -  Consider discontinuing  Singulair (Montelukast) -   Recurrent sinusitis/ear infections:  - labs today to check your immune system, particular concern for antibody portion of immune system - sinus imaging CT scan-someone will call you to schedule  Pending lab results and CT scan, we will call you to discuss next steps.  This note in its entirety was forwarded to the Provider who requested this consultation.  Thank you for your kind referral. I appreciate the opportunity to take part in Jakory's care. Please do not hesitate to contact me with questions.  Sincerely,  Sigurd Sos, MD [facility] [place]

## 2022-01-26 ENCOUNTER — Encounter: Payer: Self-pay | Admitting: Internal Medicine

## 2022-01-26 ENCOUNTER — Ambulatory Visit: Payer: Medicare HMO | Admitting: Internal Medicine

## 2022-01-26 ENCOUNTER — Other Ambulatory Visit: Payer: Self-pay

## 2022-01-26 VITALS — BP 120/76 | HR 97 | Temp 97.9°F | Resp 16 | Ht 67.0 in | Wt 207.4 lb

## 2022-01-26 DIAGNOSIS — J310 Chronic rhinitis: Secondary | ICD-10-CM | POA: Insufficient documentation

## 2022-01-26 DIAGNOSIS — J329 Chronic sinusitis, unspecified: Secondary | ICD-10-CM | POA: Diagnosis not present

## 2022-01-26 NOTE — Patient Instructions (Signed)
Chronic Rhinitis: - allergy testing today was negative (both skin test and intradermal testing); we will confirm with blood work - Continue Nasal Steroid Spray: Options include Flonase (fluticasone), Nasocort (triamcinolone), Nasonex (mometasome) 1- 2 sprays in each nostril daily (can buy over-the-counter if not covered by insurance)  Best results if used daily. - Continue over the counter antihistamine daily or daily as needed.   -Your options include Zyrtec (Cetirizine) 10mg , Claritin (Loratadine) 10mg , Allegra (Fexofenadine) 180mg , or Xyzal (Levocetirinze) 5mg  - continue sinus rinses twice daily  -  Please ask you Urologist if they would be okay with you trying this medication:  Atrovent (Ipratropium Bromide) 1-2 sprays in each nostril up to 3 times a day as needed for runny nose/post nasal drip/drainage.  Use less frequently if airway gets too dry.  It may make urinary retention symptoms worse and if so would need to be discontinued.  -  Consider discontinuing  Singulair (Montelukast) -   Recurrent sinusitis/ear infections:  - labs today to check your immune system, particular concern for antibody portion of immune system - sinus imaging CT scan-someone will call you to schedule  Pending lab results and CT scan, we will call you to discuss next steps.

## 2022-01-27 ENCOUNTER — Telehealth (HOSPITAL_BASED_OUTPATIENT_CLINIC_OR_DEPARTMENT_OTHER): Payer: Self-pay

## 2022-01-27 ENCOUNTER — Telehealth: Payer: Self-pay

## 2022-01-27 DIAGNOSIS — J329 Chronic sinusitis, unspecified: Secondary | ICD-10-CM

## 2022-01-27 NOTE — Telephone Encounter (Signed)
Patient needs a CT for recurrent sinusitis per Dr.Jeanluc Wegman.

## 2022-01-30 NOTE — Telephone Encounter (Signed)
Ok thank you 

## 2022-01-30 NOTE — Telephone Encounter (Signed)
I called the patient he was not able to get it done at the [facility] so I resent the order to [facility]. Patient has been notified and plans to get the scan done on Wednesday.

## 2022-01-30 NOTE — Telephone Encounter (Signed)
Hey,  We do not order CT's the clinical staff will have to handle this & make sure the patient doesn't require a Prior Auth.   Thanks

## 2022-01-31 ENCOUNTER — Telehealth: Payer: Self-pay | Admitting: Internal Medicine

## 2022-01-31 DIAGNOSIS — J329 Chronic sinusitis, unspecified: Secondary | ICD-10-CM

## 2022-01-31 NOTE — Telephone Encounter (Signed)
Patient states was told to go to [facility] he did not need an appointment patient called before going they stated they had no orders for him and he needed an appointment asking for a call from the nurse please advise

## 2022-02-01 LAB — STREP PNEUMONIAE 23 SEROTYPES IGG
Pneumo Ab Type 1*: 0.8 ug/mL — ABNORMAL LOW (ref >1.3)
Pneumo Ab Type 12 (12F)*: 0.5 ug/mL — ABNORMAL LOW (ref >1.3)
Pneumo Ab Type 14*: 16.2 ug/mL (ref >1.3)
Pneumo Ab Type 17 (17F)*: 3.9 ug/mL (ref >1.3)
Pneumo Ab Type 19 (19F)*: 28.3 ug/mL (ref >1.3)
Pneumo Ab Type 2*: 1.8 ug/mL (ref >1.3)
Pneumo Ab Type 20*: 2.4 ug/mL (ref >1.3)
Pneumo Ab Type 22 (22F)*: 0.9 ug/mL — ABNORMAL LOW (ref >1.3)
Pneumo Ab Type 23 (23F)*: 0.2 ug/mL — ABNORMAL LOW (ref >1.3)
Pneumo Ab Type 26 (6B)*: 0.9 ug/mL — ABNORMAL LOW (ref >1.3)
Pneumo Ab Type 3*: 0.9 ug/mL — ABNORMAL LOW (ref >1.3)
Pneumo Ab Type 34 (10A)*: 0.4 ug/mL — ABNORMAL LOW (ref >1.3)
Pneumo Ab Type 4*: 0.6 ug/mL — ABNORMAL LOW (ref >1.3)
Pneumo Ab Type 43 (11A)*: 0.3 ug/mL — ABNORMAL LOW (ref >1.3)
Pneumo Ab Type 5*: 0.9 ug/mL — ABNORMAL LOW (ref >1.3)
Pneumo Ab Type 51 (7F)*: 1.3 ug/mL — ABNORMAL LOW (ref >1.3)
Pneumo Ab Type 54 (15B)*: 0.2 ug/mL — ABNORMAL LOW (ref >1.3)
Pneumo Ab Type 56 (18C)*: 0.7 ug/mL — ABNORMAL LOW (ref >1.3)
Pneumo Ab Type 57 (19A)*: 4.3 ug/mL (ref >1.3)
Pneumo Ab Type 68 (9V)*: 0.5 ug/mL — ABNORMAL LOW (ref >1.3)
Pneumo Ab Type 70 (33F)*: 2.3 ug/mL (ref >1.3)
Pneumo Ab Type 8*: 1 ug/mL — ABNORMAL LOW (ref >1.3)
Pneumo Ab Type 9 (9N)*: 0.6 ug/mL — ABNORMAL LOW (ref >1.3)

## 2022-02-01 LAB — CBC WITH DIFFERENTIAL
Basophils Absolute: 0 10*3/uL (ref 0.0–0.2)
Basos: 1 %
EOS (ABSOLUTE): 0.1 10*3/uL (ref 0.0–0.4)
Eos: 1 %
Hematocrit: 45.2 % (ref 37.5–51.0)
Hemoglobin: 15.1 g/dL (ref 13.0–17.7)
Immature Grans (Abs): 0.1 10*3/uL (ref 0.0–0.1)
Immature Granulocytes: 1 %
Lymphocytes Absolute: 0.7 10*3/uL (ref 0.7–3.1)
Lymphs: 10 %
MCH: 30.3 pg (ref 26.6–33.0)
MCHC: 33.4 g/dL (ref 31.5–35.7)
MCV: 91 fL (ref 79–97)
Monocytes Absolute: 0.5 10*3/uL (ref 0.1–0.9)
Monocytes: 6 %
Neutrophils Absolute: 5.9 10*3/uL (ref 1.4–7.0)
Neutrophils: 81 %
RBC: 4.99 x10E6/uL (ref 4.14–5.80)
RDW: 12.8 % (ref 11.6–15.4)
WBC: 7.3 10*3/uL (ref 3.4–10.8)

## 2022-02-01 LAB — ALLERGENS, ZONE 2

## 2022-02-01 LAB — COMPLEMENT, TOTAL: Compl, Total (CH50): >60 U/mL (ref >41)

## 2022-02-01 LAB — DIPHTHERIA / TETANUS ANTIBODY PANEL
Diphtheria Ab: 1.67 IU/mL (ref <0.10)
Tetanus Ab, IgG: 2.75 IU/mL (ref <0.10)

## 2022-02-01 LAB — IGG, IGA, IGM
IgA/Immunoglobulin A, Serum: 445 mg/dL — ABNORMAL HIGH (ref 61–437)
IgG (Immunoglobin G), Serum: 1601 mg/dL (ref 603–1613)
IgM (Immunoglobulin M), Srm: 63 mg/dL (ref 20–172)

## 2022-02-01 NOTE — Telephone Encounter (Signed)
Lm for pt to call us back yes he has to have an appt for [facility] and not one for [facility]

## 2022-02-01 NOTE — Telephone Encounter (Signed)
Pt called back redone order sent it to [facility] and called gi and informed them of the order they are reaching out to pt now to get it scheduled

## 2022-02-01 NOTE — Addendum Note (Signed)
Addended by: Felipa Emory on: 02/01/2022 11:25 AM   Modules accepted: Orders

## 2022-02-09 ENCOUNTER — Telehealth: Payer: Self-pay | Admitting: *Deleted

## 2022-02-09 DIAGNOSIS — J329 Chronic sinusitis, unspecified: Secondary | ICD-10-CM

## 2022-02-09 NOTE — Telephone Encounter (Signed)
Pt stated he received the pneumovax injection yesterday and he will need the post titers. I mailed requisition orders and he will go back around April 13.  ?

## 2022-02-14 ENCOUNTER — Telehealth: Payer: Self-pay

## 2022-02-14 NOTE — Telephone Encounter (Signed)
Message sent to MD for medication side effects per patient request in regards to atrovent with side effects of urinary retention ?

## 2022-02-16 NOTE — Telephone Encounter (Signed)
Patient notified of Sharee Pimple recommendation. Patient voiced understanding. Will speak with allergist.  ?

## 2022-02-24 ENCOUNTER — Other Ambulatory Visit: Payer: Self-pay

## 2022-02-24 ENCOUNTER — Ambulatory Visit
Admission: RE | Admit: 2022-02-24 | Discharge: 2022-02-24 | Disposition: A | Discharge status: Home / Self Care | Payer: Medicare HMO | Source: Ambulatory Visit | Attending: Internal Medicine | Admitting: Internal Medicine

## 2022-02-24 DIAGNOSIS — J329 Chronic sinusitis, unspecified: Secondary | ICD-10-CM

## 2022-02-24 DIAGNOSIS — H6690 Otitis media, unspecified, unspecified ear: Secondary | ICD-10-CM | POA: Diagnosis not present

## 2022-02-27 ENCOUNTER — Telehealth: Payer: Self-pay

## 2022-02-27 MED ORDER — IPRATROPIUM BROMIDE 0.03 % NA SOLN: 2.0000 | Freq: Three times a day (TID) | NASAL | 12 refills | Status: DC

## 2022-02-27 NOTE — Telephone Encounter (Signed)
Sent in rx.

## 2022-02-27 NOTE — Progress Notes (Signed)
Please let Mr. Jeremy Casey know that his sinus imaging is reassuring and does not explain his chronic symptoms.   ?We are still waiting on his Pneumovax 23 vaccination repeat titers to complete his immune evaluation. ? ?Please let me know if he has any questions or concerns

## 2022-02-27 NOTE — Telephone Encounter (Signed)
Pt talked to urinologist and he said it was okay to start atrovent. Please advise to rx for atrovent. Send to pharmacy [facility] ?

## 2022-02-27 NOTE — Addendum Note (Signed)
Addended by: Felipa Emory on: 02/27/2022 04:36 PM ? ? Modules accepted: Orders ? ?

## 2022-03-28 DIAGNOSIS — L718 Other rosacea: Secondary | ICD-10-CM | POA: Diagnosis not present

## 2022-03-28 DIAGNOSIS — Z09 Encounter for follow-up examination after completed treatment for conditions other than malignant neoplasm: Secondary | ICD-10-CM | POA: Diagnosis not present

## 2022-03-28 DIAGNOSIS — L218 Other seborrheic dermatitis: Secondary | ICD-10-CM | POA: Diagnosis not present

## 2022-03-28 DIAGNOSIS — L570 Actinic keratosis: Secondary | ICD-10-CM | POA: Diagnosis not present

## 2022-03-30 DIAGNOSIS — J329 Chronic sinusitis, unspecified: Secondary | ICD-10-CM | POA: Diagnosis not present

## 2022-04-03 DIAGNOSIS — H6122 Impacted cerumen, left ear: Secondary | ICD-10-CM | POA: Diagnosis not present

## 2022-04-03 DIAGNOSIS — H9211 Otorrhea, right ear: Secondary | ICD-10-CM | POA: Diagnosis not present

## 2022-04-03 DIAGNOSIS — H7293 Unspecified perforation of tympanic membrane, bilateral: Secondary | ICD-10-CM | POA: Diagnosis not present

## 2022-04-06 LAB — STREP PNEUMONIAE 23 SEROTYPES IGG
Pneumo Ab Type 1*: 1.7 ug/mL (ref >1.3)
Pneumo Ab Type 12 (12F)*: 0.4 ug/mL — ABNORMAL LOW (ref >1.3)
Pneumo Ab Type 14*: >18.7 ug/mL (ref >1.3)
Pneumo Ab Type 17 (17F)*: 12.5 ug/mL (ref >1.3)
Pneumo Ab Type 19 (19F)*: >35.9 ug/mL (ref >1.3)
Pneumo Ab Type 2*: 3.8 ug/mL (ref >1.3)
Pneumo Ab Type 20*: 4.3 ug/mL (ref >1.3)
Pneumo Ab Type 22 (22F)*: 1.3 ug/mL — ABNORMAL LOW (ref >1.3)
Pneumo Ab Type 23 (23F)*: 0.2 ug/mL — ABNORMAL LOW (ref >1.3)
Pneumo Ab Type 26 (6B)*: 0.9 ug/mL — ABNORMAL LOW (ref >1.3)
Pneumo Ab Type 3*: 1 ug/mL — ABNORMAL LOW (ref >1.3)
Pneumo Ab Type 34 (10A)*: 0.7 ug/mL — ABNORMAL LOW (ref >1.3)
Pneumo Ab Type 4*: 0.8 ug/mL — ABNORMAL LOW (ref >1.3)
Pneumo Ab Type 43 (11A)*: 0.3 ug/mL — ABNORMAL LOW (ref >1.3)
Pneumo Ab Type 5*: 1.4 ug/mL (ref >1.3)
Pneumo Ab Type 51 (7F)*: 2.8 ug/mL (ref >1.3)
Pneumo Ab Type 54 (15B)*: 0.3 ug/mL — ABNORMAL LOW (ref >1.3)
Pneumo Ab Type 56 (18C)*: 2.3 ug/mL (ref >1.3)
Pneumo Ab Type 57 (19A)*: 4.7 ug/mL (ref >1.3)
Pneumo Ab Type 68 (9V)*: 1.3 ug/mL — ABNORMAL LOW (ref >1.3)
Pneumo Ab Type 70 (33F)*: 4 ug/mL (ref >1.3)
Pneumo Ab Type 8*: 1.5 ug/mL (ref >1.3)
Pneumo Ab Type 9 (9N)*: 0.7 ug/mL — ABNORMAL LOW (ref >1.3)

## 2022-07-17 DIAGNOSIS — H906 Mixed conductive and sensorineural hearing loss, bilateral: Secondary | ICD-10-CM | POA: Diagnosis not present

## 2022-07-18 DIAGNOSIS — N401 Enlarged prostate with lower urinary tract symptoms: Secondary | ICD-10-CM | POA: Diagnosis not present

## 2022-07-18 DIAGNOSIS — Z0000 Encounter for general adult medical examination without abnormal findings: Secondary | ICD-10-CM | POA: Diagnosis not present

## 2022-07-18 DIAGNOSIS — Z1331 Encounter for screening for depression: Secondary | ICD-10-CM | POA: Diagnosis not present

## 2022-07-18 DIAGNOSIS — J309 Allergic rhinitis, unspecified: Secondary | ICD-10-CM | POA: Diagnosis not present

## 2022-07-18 DIAGNOSIS — I129 Hypertensive chronic kidney disease with stage 1 through stage 4 chronic kidney disease, or unspecified chronic kidney disease: Secondary | ICD-10-CM | POA: Diagnosis not present

## 2022-07-18 DIAGNOSIS — N1831 Chronic kidney disease, stage 3a: Secondary | ICD-10-CM | POA: Diagnosis not present

## 2022-07-25 DIAGNOSIS — H7293 Unspecified perforation of tympanic membrane, bilateral: Secondary | ICD-10-CM | POA: Diagnosis not present

## 2022-07-25 DIAGNOSIS — H6122 Impacted cerumen, left ear: Secondary | ICD-10-CM | POA: Diagnosis not present

## 2022-08-24 DIAGNOSIS — S0502XA Injury of conjunctiva and corneal abrasion without foreign body, left eye, initial encounter: Secondary | ICD-10-CM | POA: Diagnosis not present

## 2022-09-11 DIAGNOSIS — L821 Other seborrheic keratosis: Secondary | ICD-10-CM | POA: Diagnosis not present

## 2022-09-11 DIAGNOSIS — L538 Other specified erythematous conditions: Secondary | ICD-10-CM | POA: Diagnosis not present

## 2022-09-11 DIAGNOSIS — L570 Actinic keratosis: Secondary | ICD-10-CM | POA: Diagnosis not present

## 2022-09-11 DIAGNOSIS — L820 Inflamed seborrheic keratosis: Secondary | ICD-10-CM | POA: Diagnosis not present

## 2022-09-11 DIAGNOSIS — D225 Melanocytic nevi of trunk: Secondary | ICD-10-CM | POA: Diagnosis not present

## 2022-09-11 DIAGNOSIS — Z85828 Personal history of other malignant neoplasm of skin: Secondary | ICD-10-CM | POA: Diagnosis not present

## 2022-09-11 DIAGNOSIS — D485 Neoplasm of uncertain behavior of skin: Secondary | ICD-10-CM | POA: Diagnosis not present

## 2022-09-11 DIAGNOSIS — L718 Other rosacea: Secondary | ICD-10-CM | POA: Diagnosis not present

## 2022-09-11 DIAGNOSIS — L814 Other melanin hyperpigmentation: Secondary | ICD-10-CM | POA: Diagnosis not present

## 2022-09-11 DIAGNOSIS — Z08 Encounter for follow-up examination after completed treatment for malignant neoplasm: Secondary | ICD-10-CM | POA: Diagnosis not present

## 2022-09-11 DIAGNOSIS — L728 Other follicular cysts of the skin and subcutaneous tissue: Secondary | ICD-10-CM | POA: Diagnosis not present

## 2022-10-02 IMAGING — CT CT MAXILLOFACIAL W/O CM
1 series · 15 of 30 positions shown, 19 images · non-contrast
Comparison: None.

CLINICAL DATA: Recurrent sinus infections. Chronic ear infections.
Evaluate for sinus or nasal mass.



[Series 5: soft tissue · axial · 0.34mm/px · z∈[-170,-42]mm · 15 of 138 slices shown, 19 images]
[im 5/138  brain]
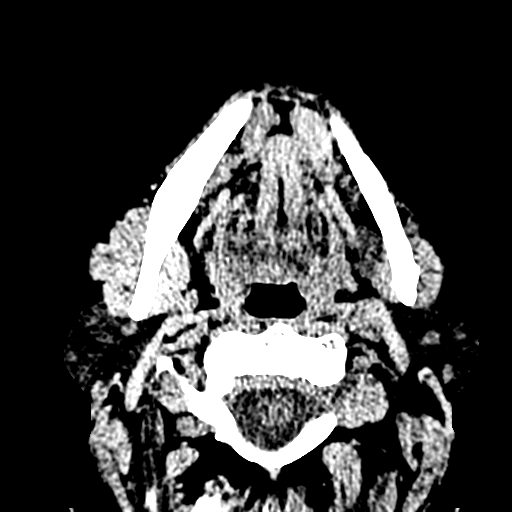
[im 5/138  bone]
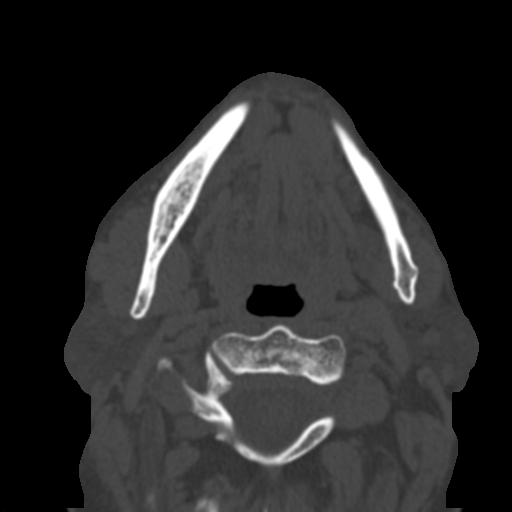
[im 15/138  bone]
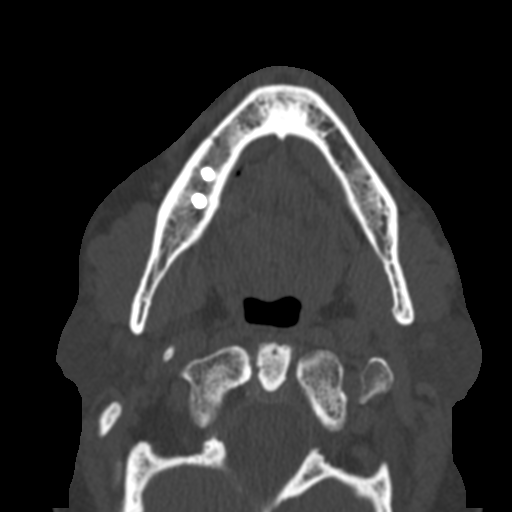
[im 24/138  bone]
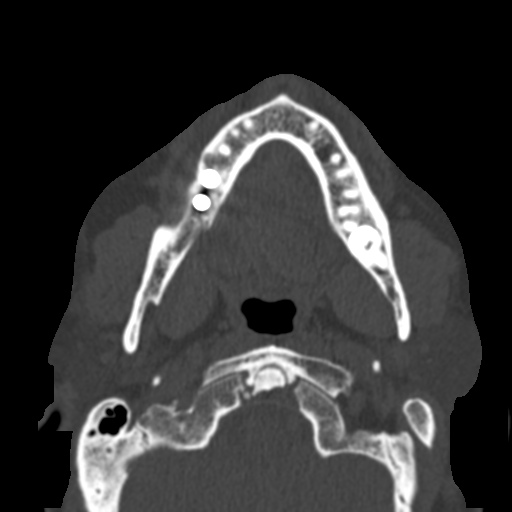
[im 34/138  bone]
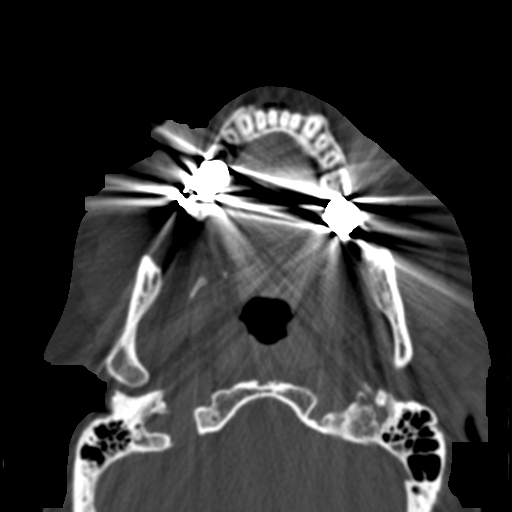
[im 43/138  brain]
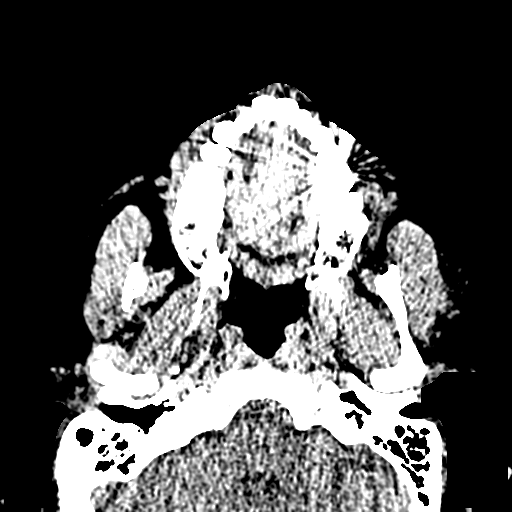
[im 43/138  bone]
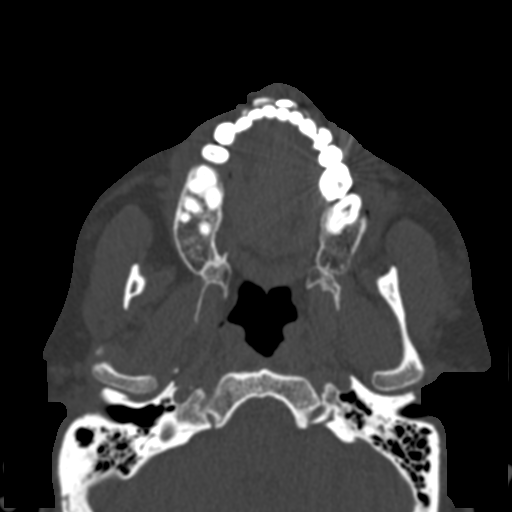
[im 52/138  bone]
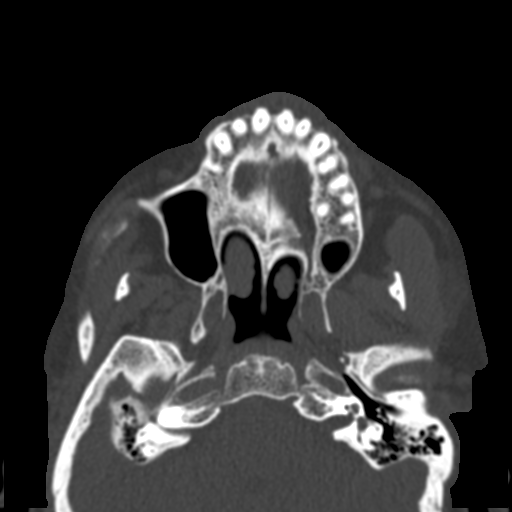
[im 62/138  bone]
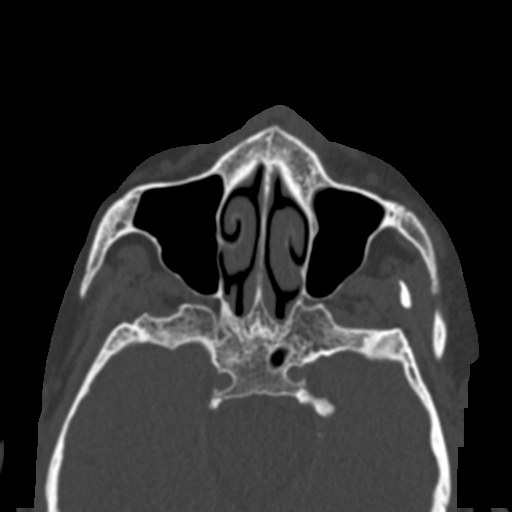
[im 71/138  bone]
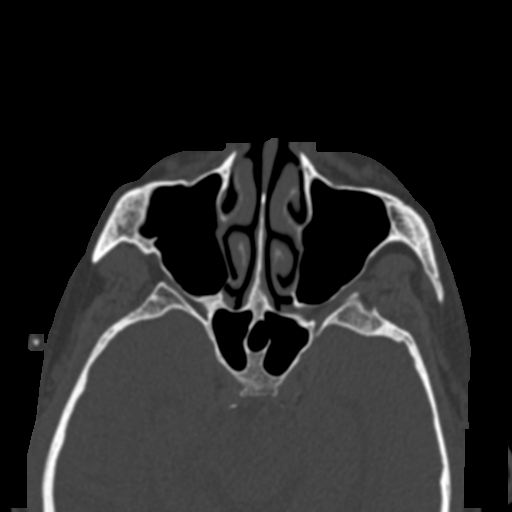
[im 76/138  brain]
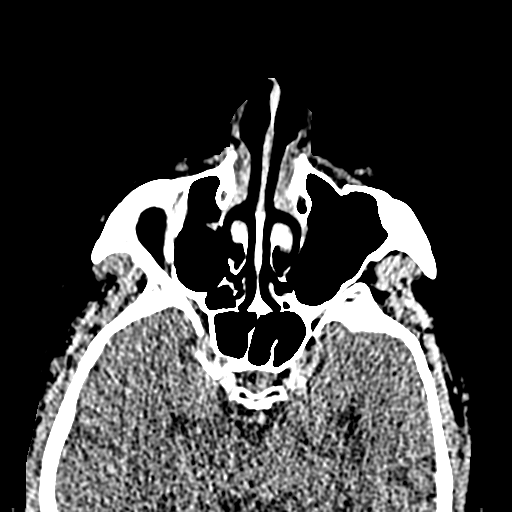
[im 76/138  bone]
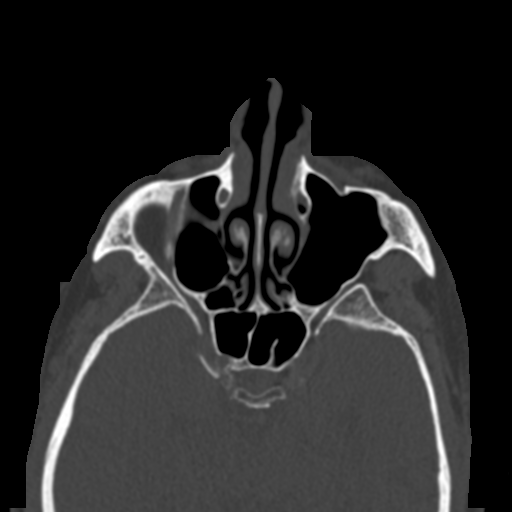
[im 86/138  bone]
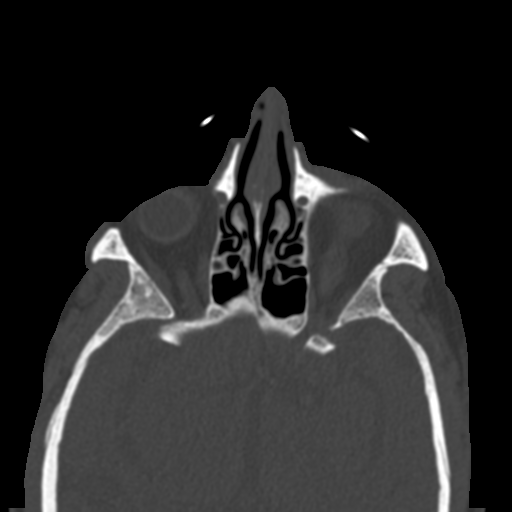
[im 95/138  bone]
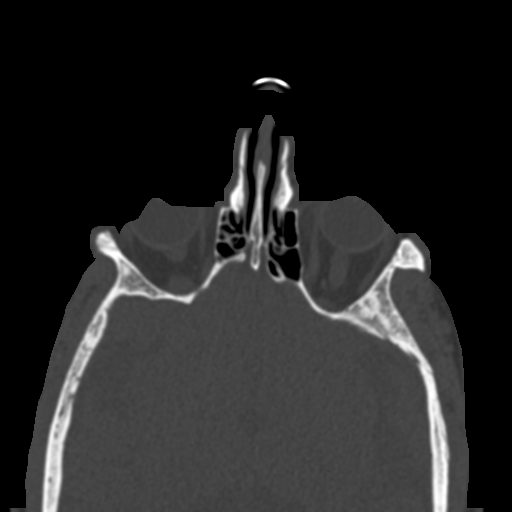
[im 104/138  bone]
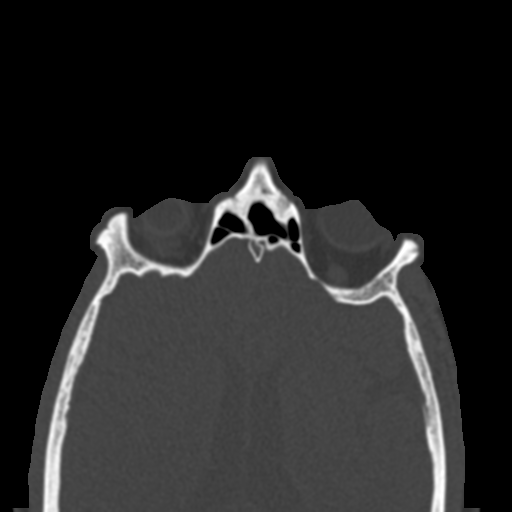
[im 114/138  brain]
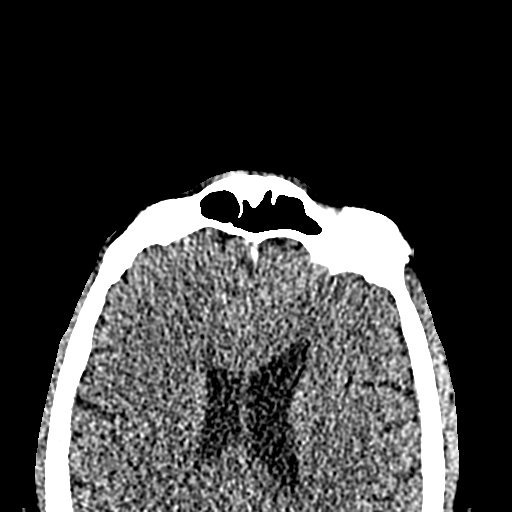
[im 114/138  bone]
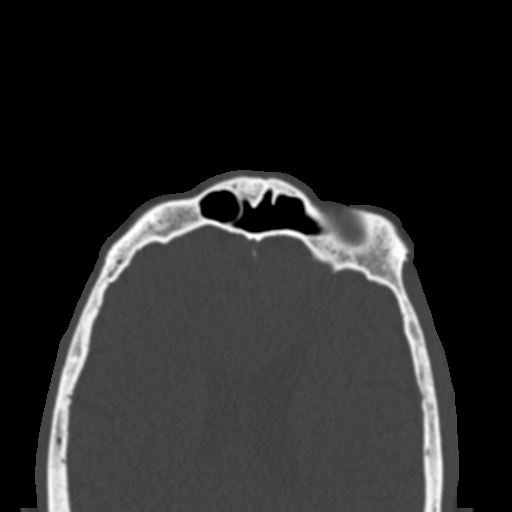
[im 123/138  bone]
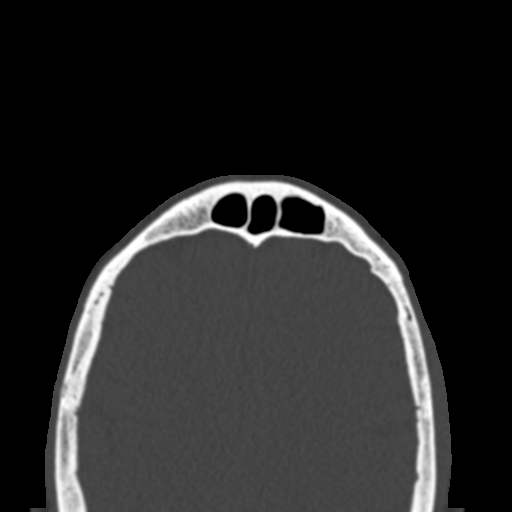
[im 133/138  bone]
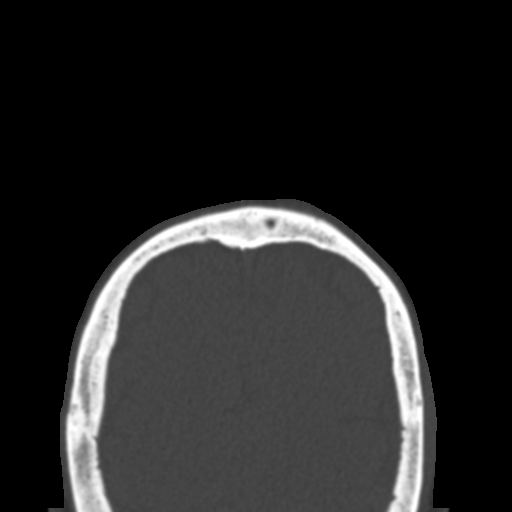

[15 of 30 positions shown; findings below may reference images not displayed]

FINDINGS: Osseous: No acute fracture is seen. The bilateral temporomandibular
joints are intact. The zygomatic arches are intact. The pterygoid
plates are intact.

Atlantodens interval is intact with moderate degenerative change.

Orbits: The superior inferior medial and lateral aspect of each bony
orbit is intact. The orbital globes are intact. The extraocular
muscles are intact.

Sinuses: The frontal ethmoid, maxillary and sphenoid sinuses are
well aerated. No air-fluid levels. There is approximately 8 mm
depression of the posterior aspect of the left lamina papyracea
(axial series 4, image 79), presumably the sequela of remote trauma.

The bilateral ostiomeatal complexes are clear. No significant nasal
septal deviation.

Soft tissues: No significant soft tissue swelling. No fluid
collection or mass is seen.

Limited intracranial: No significant or unexpected finding.
IMPRESSION: :
IMPRESSION: 1. The paranasal sinuses are clear.
2. No sinus or nasal mass is seen.

## 2022-11-01 ENCOUNTER — Other Ambulatory Visit: Payer: Medicare HMO

## 2022-11-01 DIAGNOSIS — R972 Elevated prostate specific antigen [PSA]: Secondary | ICD-10-CM | POA: Diagnosis not present

## 2022-11-02 LAB — PSA: Prostate Specific Ag, Serum: 6.7 ng/mL — ABNORMAL HIGH (ref 0.0–4.0)

## 2022-11-08 ENCOUNTER — Ambulatory Visit (INDEPENDENT_AMBULATORY_CARE_PROVIDER_SITE_OTHER): Payer: Medicare HMO | Admitting: Urology

## 2022-11-08 ENCOUNTER — Encounter: Payer: Self-pay | Admitting: Urology

## 2022-11-08 VITALS — BP 129/76 | HR 83

## 2022-11-08 DIAGNOSIS — R351 Nocturia: Secondary | ICD-10-CM

## 2022-11-08 DIAGNOSIS — R972 Elevated prostate specific antigen [PSA]: Secondary | ICD-10-CM

## 2022-11-08 NOTE — Progress Notes (Signed)
11/08/2022 10:44 AM   Jeremy Casey September 04, 1951 [id number]  Referring provider: Maury Dus, MD [place],  [place] [address]  Elevated PSA and BPH   HPI: Jeremy Casey is a 71yo here for followup for elevatd PSa and BPH with Nocturia. IPSS 10 QOl 3 on no BPh therapy. He has noted worsening nocturia to 3x and hew dribbling. Urine stream strong. No straining to urinate. PSA increased to 6.7 from 3.9. He denies dysuria or hematuria. No other complaints today  PMH: Past Medical History:  Diagnosis Date   Crohn's disease ([place])    Elevated PSA    Essential hypertension, benign    [facility]    Surgical History: Past Surgical History:  Procedure Laterality Date   HIP SURGERY     KNEE SURGERY     MOUTH SURGERY  2014   repeat from root canal, 2 teeth pulled   PE tube placement  2002   secondary to serous otitis   ROOT CANAL  2013   failed    Home Medications:  Allergies as of 11/08/2022       Reactions   Tamsulosin Hcl Other (See Comments)   Amoxicillin Other (See Comments)        Medication List        Accurate as of November 08, 2022 10:44 AM. If you have any questions, ask your nurse or doctor.          aspirin 81 MG chewable tablet 1 tablet   Breo Ellipta 200-25 MCG/ACT Aepb Generic drug: fluticasone furoate-vilanterol 1 puff   ciprofloxacin-dexamethasone OTIC suspension Commonly known as: Ciprodex Place 4 drops into the right ear 2 (two) times daily.   clindamycin 1 % gel Commonly known as: CLINDAGEL   fluticasone 0.05 % cream Commonly known as: CUTIVATE fluticasone propionate 0.05 % topical cream  APPLY TOPICALLY TO FACE TWICE A DAY   fluticasone 50 MCG/ACT nasal spray Commonly known as: FLONASE Place into both nostrils daily.   ipratropium 0.03 % nasal spray Commonly known as: ATROVENT Place 2 sprays into both nostrils 3 (three) times daily.   olmesartan-hydrochlorothiazide 20-12.5 MG tablet Commonly known as:  BENICAR HCT Take 0.5 tablets by mouth daily.        Allergies:  Allergies  Allergen Reactions   Tamsulosin Hcl Other (See Comments)   Amoxicillin Other (See Comments)    Family History: Family History  Problem Relation Age of Onset   Hodgkin's lymphoma Father    Cancer Father    ALS Mother    Hypertension Brother    Heart attack Unknown     Social History:  reports that he has never smoked. He has never used smokeless tobacco. He reports current alcohol use. He reports that he does not use drugs.  ROS: All other review of systems were reviewed and are negative except what is noted above in HPI  Physical Exam: BP 129/76   Pulse 83   Constitutional:  Alert and oriented, No acute distress. HEENT:  AT, moist mucus membranes.  Trachea midline, no masses. Cardiovascular: No clubbing, cyanosis, or edema. Respiratory: Normal respiratory effort, no increased work of breathing. GI: Abdomen is soft, nontender, nondistended, no abdominal masses GU: No CVA tenderness.  Lymph: No cervical or inguinal lymphadenopathy. Skin: No rashes, bruises or suspicious lesions. Neurologic: Grossly intact, no focal deficits, moving all 4 extremities. Psychiatric: Normal mood and affect.  Laboratory Data: Lab Results  Component Value Date   WBC 7.3 01/26/2022  HGB 15.1 01/26/2022   HCT 45.2 01/26/2022   MCV 91 01/26/2022   PLT 195 10/16/2016    Lab Results  Component Value Date   CREATININE 1.14 10/16/2016    No results found for: "PSA"  No results found for: "TESTOSTERONE"  No results found for: "HGBA1C"  Urinalysis    Component Value Date/Time   COLORURINE YELLOW 10/16/2016 2320   APPEARANCEUR Clear 11/07/2021 1325   LABSPEC 1.010 10/16/2016 2320   PHURINE 5.5 10/16/2016 2320   GLUCOSEU Negative 11/07/2021 1325   HGBUR TRACE (A) 10/16/2016 2320   BILIRUBINUR Negative 11/07/2021 1325   KETONESUR 40 (A) 10/16/2016 2320   PROTEINUR Negative 11/07/2021 1325   PROTEINUR  NEGATIVE 10/16/2016 2320   NITRITE Negative 11/07/2021 1325   NITRITE NEGATIVE 10/16/2016 2320   LEUKOCYTESUR Negative 11/07/2021 1325    Lab Results  Component Value Date   LABMICR See below: 11/07/2021   WBCUA None seen 11/07/2021   LABEPIT None seen 11/07/2021   MUCUS Present 10/27/2020   BACTERIA None seen 11/07/2021    Pertinent Imaging:  No results found for this or any previous visit.  No results found for this or any previous visit.  No results found for this or any previous visit.  No results found for this or any previous visit.  No results found for this or any previous visit.  No valid procedures specified. No results found for this or any previous visit.  No results found for this or any previous visit.   Assessment & Plan:    1. Elevated prostate specific antigen (PSA) -Recheck PSA today. If stable followup in 1 year with PSA.    No follow-ups on file.  Jeremy Bang, MD  [facility] [employer]

## 2022-11-08 NOTE — Patient Instructions (Signed)

## 2022-11-09 LAB — PSA: Prostate Specific Ag, Serum: 7.7 ng/mL — ABNORMAL HIGH (ref 0.0–4.0)

## 2022-11-09 NOTE — Progress Notes (Signed)
Patient made aware of PSA results at ov.

## 2022-11-13 ENCOUNTER — Telehealth: Payer: Self-pay

## 2022-11-13 MED ORDER — DOXYCYCLINE HYCLATE 100 MG PO CAPS: 100.0000 mg | ORAL_CAPSULE | Freq: Two times a day (BID) | ORAL | 0 refills | Status: DC

## 2022-11-13 NOTE — Telephone Encounter (Signed)
Patient called and made aware that doxycyline po bid x 28 days was sent to pharmacy.  Patient voiced understanding.

## 2022-11-13 NOTE — Telephone Encounter (Signed)
Patient called advising that the antibiotic was not received by below pharmacy for patients prostate infection. He wanted to follow up and see if it could be resent.   Pharmacy: [id number] [place], [place] - [id number]     Thank you

## 2022-11-16 ENCOUNTER — Telehealth: Payer: Self-pay

## 2022-11-16 NOTE — Telephone Encounter (Signed)
-----   Message from Cleon Gustin, MD sent at 11/16/2022  8:55 AM EST ----- PSa increased further. He needs to be scheduled for a prostate biopsy ----- Message ----- From: Iris Pert, LPN Sent: 53/69/2230   9:26 AM EST To: Cleon Gustin, MD  Please review

## 2022-11-16 NOTE — Telephone Encounter (Signed)
Patient states he does not want to proceed with prostate biopsy at this time. He wants to wait until his next psa result.

## 2022-11-20 NOTE — Telephone Encounter (Signed)
Patient call in today and want to know if his PSA lab appt need to be change due to him taking antibx for prostate infection. Made patient aware that we can change lab appt and after Dr. Alyson Ingles review PSA result then someone will contact him and let him know what the next steps will be, patient voiced understanding

## 2022-12-18 ENCOUNTER — Other Ambulatory Visit: Payer: Medicare HMO | Admitting: Urology

## 2022-12-20 DIAGNOSIS — L244 Irritant contact dermatitis due to drugs in contact with skin: Secondary | ICD-10-CM | POA: Diagnosis not present

## 2022-12-20 DIAGNOSIS — L700 Acne vulgaris: Secondary | ICD-10-CM | POA: Diagnosis not present

## 2022-12-20 DIAGNOSIS — L718 Other rosacea: Secondary | ICD-10-CM | POA: Diagnosis not present

## 2022-12-20 DIAGNOSIS — Z09 Encounter for follow-up examination after completed treatment for conditions other than malignant neoplasm: Secondary | ICD-10-CM | POA: Diagnosis not present

## 2022-12-20 DIAGNOSIS — L570 Actinic keratosis: Secondary | ICD-10-CM | POA: Diagnosis not present

## 2022-12-21 ENCOUNTER — Other Ambulatory Visit: Payer: Medicare HMO

## 2022-12-21 DIAGNOSIS — R972 Elevated prostate specific antigen [PSA]: Secondary | ICD-10-CM | POA: Diagnosis not present

## 2022-12-22 LAB — PSA: Prostate Specific Ag, Serum: 8.7 ng/mL — ABNORMAL HIGH (ref 0.0–4.0)

## 2022-12-26 ENCOUNTER — Other Ambulatory Visit: Payer: Medicare HMO

## 2022-12-27 ENCOUNTER — Telehealth: Payer: Self-pay

## 2022-12-27 DIAGNOSIS — R972 Elevated prostate specific antigen [PSA]: Secondary | ICD-10-CM

## 2022-12-27 MED ORDER — LEVOFLOXACIN 750 MG PO TABS: 750.0000 mg | ORAL_TABLET | Freq: Once | ORAL | 0 refills | Status: AC

## 2022-12-27 NOTE — Telephone Encounter (Signed)
Patient called in today for PSA results, Received verbal from DR. McKenzie to scheduled patient for prostate biopsy. Gave patient biopsy instruction over the phone and mail biopsy instruction to patient. Patient voiced understanding

## 2023-01-01 ENCOUNTER — Encounter: Payer: Self-pay | Admitting: Urology

## 2023-01-01 ENCOUNTER — Ambulatory Visit: Payer: Medicare HMO | Admitting: Urology

## 2023-01-01 ENCOUNTER — Encounter (HOSPITAL_COMMUNITY): Payer: Self-pay

## 2023-01-01 ENCOUNTER — Ambulatory Visit (HOSPITAL_COMMUNITY)
Admission: RE | Admit: 2023-01-01 | Discharge: 2023-01-01 | Disposition: A | Discharge status: Home / Self Care | Payer: Medicare HMO | Source: Ambulatory Visit | Attending: Urology | Admitting: Urology

## 2023-01-01 ENCOUNTER — Other Ambulatory Visit: Payer: Self-pay | Admitting: Urology

## 2023-01-01 ENCOUNTER — Ambulatory Visit (INDEPENDENT_AMBULATORY_CARE_PROVIDER_SITE_OTHER): Payer: Medicare HMO | Admitting: Urology

## 2023-01-01 DIAGNOSIS — R972 Elevated prostate specific antigen [PSA]: Secondary | ICD-10-CM

## 2023-01-01 DIAGNOSIS — D291 Benign neoplasm of prostate: Secondary | ICD-10-CM | POA: Diagnosis not present

## 2023-01-01 MED ORDER — GENTAMICIN SULFATE 40 MG/ML IJ SOLN
INTRAMUSCULAR | Status: AC
  Administered 2023-01-01: 80 mg via INTRAMUSCULAR
  Filled 2023-01-01: qty 2

## 2023-01-01 MED ORDER — GENTAMICIN SULFATE 40 MG/ML IJ SOLN: 80.0000 mg | Freq: Once | INTRAMUSCULAR | Status: AC

## 2023-01-01 MED ORDER — LIDOCAINE HCL (PF) 2 % IJ SOLN
INTRAMUSCULAR | Status: AC
  Filled 2023-01-01: qty 10

## 2023-01-01 NOTE — Patient Instructions (Signed)
Transrectal Ultrasound-Guided Prostate Biopsy, Care After What can I expect after the procedure? After the procedure, it is common to have: Pain and discomfort near your butt (rectum), especially while sitting. Pink-colored pee (urine). This is due to small amounts of blood in your pee. A burning feeling while peeing. Blood in your poop (stool). Bleeding from your butt. Blood in your semen. Follow these instructions at home: Medicines Take over-the-counter and prescription medicines only as told by your doctor. If you were given a sedative during your procedure, do not drive or use machines until your doctor says that it is safe. A sedative is a medicine that helps you relax. If you were prescribed an antibiotic medicine, take it as told by your doctor. Do not stop taking it even if you start to feel better. Activity  Return to your normal activities when your doctor says that it is safe. Ask your doctor when it is okay for you to have sex. You may have to avoid lifting. Ask your doctor how much you can safely lift. General instructions  Drink enough water to keep your pee pale yellow. Watch your pee, poop, and semen for new bleeding or bleeding that gets worse. Keep all follow-up visits. Contact a doctor if: You have any of these: Blood clots in your pee or poop. Blood in your pee more than 2 weeks after the procedure. Blood in your semen more than 2 months after the procedure. New or worse bleeding in your pee, poop, or semen. Very bad belly pain. Your pee smells bad or unusual. You have trouble peeing. Your lower belly feels firm. You have problems getting an erection. You feel like you may vomit (are nauseous), or you vomit. Get help right away if: You have a fever or chills. You have bright red pee. You have very bad pain that does not get better with medicine. You cannot pee. Summary After this procedure, it is common to have pain and discomfort near your butt,  especially while sitting. You may have blood in your pee and poop. It is common to have blood in your semen. Get help right away if you have a fever or chills. This information is not intended to replace advice given to you by your health care provider. Make sure you discuss any questions you have with your health care provider. Document Revised: 05/23/2021 Document Reviewed: 05/23/2021 Elsevier Patient Education  2023 Elsevier Inc.  

## 2023-01-01 NOTE — Progress Notes (Signed)
Prostate Biopsy Procedure   Informed consent was obtained after discussing risks/benefits of the procedure.  A time out was performed to ensure correct patient identity.  Pre-Procedure: - Last PSA Level: No results found for: "PSA" - Gentamicin given prophylactically - Levaquin 500 mg administered PO -Transrectal Ultrasound performed revealing a 67.4 gm prostate -No significant hypoechoic or median lobe noted  Procedure: - Prostate block performed using 10 cc 1% lidocaine and biopsies taken from sextant areas, a total of 12 under ultrasound guidance.  Post-Procedure: - Patient tolerated the procedure well - He was counseled to seek immediate medical attention if experiences any severe pain, significant bleeding, or fevers - Return in one week to discuss biopsy results

## 2023-01-02 ENCOUNTER — Telehealth: Payer: Self-pay

## 2023-01-02 NOTE — Telephone Encounter (Signed)
Patient called and made aware of negative biopsy.  Biopsy talk cancelled and rescheduled for 6 months with PSA

## 2023-01-08 NOTE — Telephone Encounter (Signed)
Patient called stating his prostate/urinary symptoms are not resolved and has increased since having negative prostate biopsy, and being treated with 28 days of doxycycline. Please advise on next course of action.

## 2023-01-09 MED ORDER — SULFAMETHOXAZOLE-TRIMETHOPRIM 800-160 MG PO TABS: 1.0000 | ORAL_TABLET | Freq: Two times a day (BID) | ORAL | 0 refills | Status: DC

## 2023-01-09 NOTE — Telephone Encounter (Signed)
Patient called and made aware of Bactrim sent to pharmacy.  Patient stated Tamsulosin allergy was due to drop in blood pressure. MD made aware and wished to proceed with Bactrim.

## 2023-01-10 ENCOUNTER — Ambulatory Visit: Payer: Medicare HMO | Admitting: Urology

## 2023-02-05 DIAGNOSIS — H6123 Impacted cerumen, bilateral: Secondary | ICD-10-CM | POA: Diagnosis not present

## 2023-02-13 ENCOUNTER — Other Ambulatory Visit: Payer: Medicare HMO

## 2023-03-21 ENCOUNTER — Other Ambulatory Visit: Payer: Medicare HMO

## 2023-03-21 ENCOUNTER — Other Ambulatory Visit: Payer: Self-pay

## 2023-03-21 DIAGNOSIS — R972 Elevated prostate specific antigen [PSA]: Secondary | ICD-10-CM | POA: Diagnosis not present

## 2023-03-22 LAB — PSA: Prostate Specific Ag, Serum: 5.4 ng/mL — ABNORMAL HIGH (ref 0.0–4.0)

## 2023-03-30 ENCOUNTER — Ambulatory Visit: Payer: Medicare HMO | Admitting: Urology

## 2023-03-30 ENCOUNTER — Encounter: Payer: Self-pay | Admitting: Urology

## 2023-03-30 VITALS — BP 153/76 | HR 91

## 2023-03-30 DIAGNOSIS — R972 Elevated prostate specific antigen [PSA]: Secondary | ICD-10-CM | POA: Diagnosis not present

## 2023-03-30 DIAGNOSIS — R300 Dysuria: Secondary | ICD-10-CM

## 2023-03-30 DIAGNOSIS — R351 Nocturia: Secondary | ICD-10-CM | POA: Diagnosis not present

## 2023-03-30 LAB — URINALYSIS, ROUTINE W REFLEX MICROSCOPIC
Bilirubin, UA: NEGATIVE
Glucose, UA: NEGATIVE
Ketones, UA: NEGATIVE
Leukocytes,UA: NEGATIVE
Nitrite, UA: NEGATIVE
Protein,UA: NEGATIVE
Specific Gravity, UA: 1.02 (ref 1.005–1.030)
Urobilinogen, Ur: 0.2 mg/dL (ref 0.2–1.0)
pH, UA: 5.5 (ref 5.0–7.5)

## 2023-03-30 LAB — MICROSCOPIC EXAMINATION
Bacteria, UA: NONE SEEN
Epithelial Cells (non renal): NONE SEEN /hpf (ref 0–10)
Urinalysis Comments: 0

## 2023-03-30 MED ORDER — ALFUZOSIN HCL ER 10 MG PO TB24: 10.0000 mg | ORAL_TABLET | Freq: Every day | ORAL | 11 refills | Status: DC

## 2023-03-30 NOTE — Patient Instructions (Signed)
Prostatitis  Prostatitis is swelling of the prostate gland, also called the prostate. This gland is about 1.5 inches wide and 1 inch high, and it helps to make a fluid called semen. The prostate is below a man's bladder, in front of the butt (rectum). There are different types of prostatitis. What are the causes? One type of prostatitis is caused by an infection from germs (bacteria). Another type is not caused by germs. It may be caused by: Things having to do with the nervous system. This system includes thebrain, spinal cord, and nerves. An autoimmune response. This happens when the body's disease-fighting system attacks healthy tissue in the body by mistake. Psychological factors. These have to do with how the mind works. The causes of other types of prostatitis are normally not known. What are the signs or symptoms? Symptoms of this condition depend on the type of prostatitis you have. If your condition is caused by germs: You may feel pain or burning when you pee (urinate). You may pee often and all of a sudden. You may have problems starting to pee. You may have trouble emptying your bladder when you pee. You may have fever or chills. You may feel pain in your muscles, joints, low back, or lower belly. If you have other types of prostatitis: You may pee often or all of a sudden. You may have trouble starting to pee. You may have a weak flow when you pee. You may leak pee after using the bathroom. You may have other problems, such as: Abnormal fluid coming from the penis. Pain in the testicles or penis. Pain between the butt and the testicles. Pain when fluid comes out of the penis during sex. How is this treated? Treatment for this condition depends on the type of prostatitis. Treatment may include: Medicines. These may treat pain or swelling, or they may help relax muscles. Exercises to help you move better or get stronger (physical therapy). Heat therapy. Techniques to help  you control some of the ways that your body works. Exercises to help you relax. Antibiotic medicine, if your condition is caused by germs. Warm water baths (sitz baths) to relax muscles. Follow these instructions at home: Medicines Take over-the-counter and prescription medicines only as told by your doctor. If you were prescribed an antibiotic medicine, take it as told by your doctor. Do not stop using the antibiotic even if you start to feel better. Managing pain and swelling  Take sitz baths as told by your doctor. For a sitz bath, sit in warm water that is deep enough to cover your hips and butt. If told, put heat on the painful area. Do this as often as told by your doctor. Use the heat source that your doctor recommends, such as a moist heat pack or a heating pad. Place a towel between your skin and the heat source. Leave the heat on for 20-30 minutes. Take off the heat if your skin turns bright red. This is very important if you are unable to feel pain, heat, or cold. You may have a greater risk of getting burned. General instructions Do exercises as told by your doctor, if your doctor prescribed them. Keep all follow-up visits as told by your doctor. This is important. Where to find more information National Institute of Diabetes and Digestive and Kidney Diseases: https://www.niddk.nih.gov Contact a doctor if: Your symptoms get worse. You have a fever. Get help right away if: You have chills. You feel light-headed. You feel like you may   faint. You cannot pee. You have blood or clumps of blood (blood clots) in your pee. Summary Prostatitis is swelling of the prostate gland. There are different types of prostatitis. Treatment depends on the type that you have. Take over-the-counter and prescription medicines only as told by your doctor. Get help right away of you have chills, feel light-headed, or feel like you may faint. Also get help right away if you cannot pee or you have  blood or clumps of blood in your pee. This information is not intended to replace advice given to you by your health care provider. Make sure you discuss any questions you have with your health care provider. Document Revised: 10/12/2022 Document Reviewed: 10/12/2022 Elsevier Patient Education  2023 Elsevier Inc.  

## 2023-03-30 NOTE — Progress Notes (Unsigned)
03/30/2023 8:49 AM   Nonnie Done 10-Jul-1951 [id number]  Referring provider: No referring provider defined for this encounter.  dysuria   HPI: Mr Rawles is a 71yo here for evaluation of dysuria. He underwent prostate biopsy in 01/01/2023 and 1 week after biopsy he developed dysuria and was treated with 28 days of bactrim. He notes worsening LUTS since his prostate biopsy. Nocturia has increased to 3x, urinary urgency is worse. Symptoms improve throughout the day. IPSS 19 QOL 4 on no BPH therapy. He has starting/stopping of his urinary stream. He has post void dribbling. He previously tried flomax which caused hypotension.  PSA decreased to 5.4 from 8.7   PMH: Past Medical History:  Diagnosis Date   Crohn's disease (HCC)    Elevated PSA    Essential hypertension, benign    [facility]    Surgical History: Past Surgical History:  Procedure Laterality Date   HIP SURGERY     KNEE SURGERY     MOUTH SURGERY  2014   repeat from root canal, 2 teeth pulled   PE tube placement  2002   secondary to serous otitis   ROOT CANAL  2013   failed    Home Medications:  Allergies as of 03/30/2023       Reactions   Tamsulosin Hcl Other (See Comments)   Drop in blood pressure    Amoxicillin Other (See Comments)        Medication List        Accurate as of March 30, 2023  8:49 AM. If you have any questions, ask your nurse or doctor.          aspirin 81 MG chewable tablet 1 tablet   Breo Ellipta 200-25 MCG/ACT Aepb Generic drug: fluticasone furoate-vilanterol 1 puff   ciprofloxacin-dexamethasone OTIC suspension Commonly known as: Ciprodex Place 4 drops into the right ear 2 (two) times daily.   clindamycin 1 % gel Commonly known as: CLINDAGEL   doxycycline 100 MG capsule Commonly known as: VIBRAMYCIN Take 1 capsule (100 mg total) by mouth every 12 (twelve) hours.   fluticasone 0.05 % cream Commonly known as: CUTIVATE fluticasone propionate 0.05 % topical cream   APPLY TOPICALLY TO FACE TWICE A DAY   fluticasone 50 MCG/ACT nasal spray Commonly known as: FLONASE Place into both nostrils daily.   ipratropium 0.03 % nasal spray Commonly known as: ATROVENT Place 2 sprays into both nostrils 3 (three) times daily.   olmesartan-hydrochlorothiazide 20-12.5 MG tablet Commonly known as: BENICAR HCT Take 0.5 tablets by mouth daily.   sulfamethoxazole-trimethoprim 800-160 MG tablet Commonly known as: BACTRIM DS Take 1 tablet by mouth 2 (two) times daily.        Allergies:  Allergies  Allergen Reactions   Tamsulosin Hcl Other (See Comments)    Drop in blood pressure    Amoxicillin Other (See Comments)    Family History: Family History  Problem Relation Age of Onset   Hodgkin's lymphoma Father    Cancer Father    ALS Mother    Hypertension Brother    Heart attack Unknown     Social History:  reports that he has never smoked. He has never used smokeless tobacco. He reports current alcohol use. He reports that he does not use drugs.  ROS: All other review of systems were reviewed and are negative except what is noted above in HPI  Physical Exam: BP (!) 153/76   Pulse 91   Constitutional:  Alert and oriented, No acute distress.  HEENT: [place] AT, moist mucus membranes.  Trachea midline, no masses. Cardiovascular: No clubbing, cyanosis, or edema. Respiratory: Normal respiratory effort, no increased work of breathing. GI: Abdomen is soft, nontender, nondistended, no abdominal masses GU: No CVA tenderness.  Lymph: No cervical or inguinal lymphadenopathy. Skin: No rashes, bruises or suspicious lesions. Neurologic: Grossly intact, no focal deficits, moving all 4 extremities. Psychiatric: Normal mood and affect.  Laboratory Data: Lab Results  Component Value Date   WBC 7.3 01/26/2022   HGB 15.1 01/26/2022   HCT 45.2 01/26/2022   MCV 91 01/26/2022   PLT 195 10/16/2016    Lab Results  Component Value Date   CREATININE 1.14 10/16/2016     No results found for: "PSA"  No results found for: "TESTOSTERONE"  No results found for: "HGBA1C"  Urinalysis    Component Value Date/Time   COLORURINE YELLOW 10/16/2016 2320   APPEARANCEUR Clear 11/07/2021 1325   LABSPEC 1.010 10/16/2016 2320   PHURINE 5.5 10/16/2016 2320   GLUCOSEU Negative 11/07/2021 1325   HGBUR TRACE (A) 10/16/2016 2320   BILIRUBINUR Negative 11/07/2021 1325   KETONESUR 40 (A) 10/16/2016 2320   PROTEINUR Negative 11/07/2021 1325   PROTEINUR NEGATIVE 10/16/2016 2320   NITRITE Negative 11/07/2021 1325   NITRITE NEGATIVE 10/16/2016 2320   LEUKOCYTESUR Negative 11/07/2021 1325    Lab Results  Component Value Date   LABMICR See below: 11/07/2021   WBCUA None seen 11/07/2021   LABEPIT None seen 11/07/2021   MUCUS Present 10/27/2020   BACTERIA None seen 11/07/2021    Pertinent Imaging:  No results found for this or any previous visit.  No results found for this or any previous visit.  No results found for this or any previous visit.  No results found for this or any previous visit.  No results found for this or any previous visit.  No valid procedures specified. No results found for this or any previous visit.  No results found for this or any previous visit.   Assessment & Plan:    1. Elevated prostate specific antigen (PSA) -Followup 6 months with PSA - Urinalysis, Routine w reflex microscopic  2. Nocturia -We will trial uroxatral  qhs  3. Dysuria -Uroxatral  QHS   No follow-ups on file.  Wilkie Aye, MD  [facility] [place]

## 2023-04-30 DIAGNOSIS — H7293 Unspecified perforation of tympanic membrane, bilateral: Secondary | ICD-10-CM | POA: Diagnosis not present

## 2023-06-25 ENCOUNTER — Other Ambulatory Visit: Payer: Medicare HMO

## 2023-06-25 DIAGNOSIS — R972 Elevated prostate specific antigen [PSA]: Secondary | ICD-10-CM

## 2023-06-26 LAB — PSA: Prostate Specific Ag, Serum: 5.6 ng/mL — ABNORMAL HIGH (ref 0.0–4.0)

## 2023-07-02 ENCOUNTER — Ambulatory Visit: Payer: Medicare HMO | Admitting: Urology

## 2023-07-02 VITALS — BP 158/78 | HR 80 | Ht 67.0 in | Wt 198.0 lb

## 2023-07-02 DIAGNOSIS — R351 Nocturia: Secondary | ICD-10-CM

## 2023-07-02 DIAGNOSIS — N138 Other obstructive and reflux uropathy: Secondary | ICD-10-CM

## 2023-07-02 DIAGNOSIS — N401 Enlarged prostate with lower urinary tract symptoms: Secondary | ICD-10-CM

## 2023-07-02 DIAGNOSIS — R972 Elevated prostate specific antigen [PSA]: Secondary | ICD-10-CM | POA: Diagnosis not present

## 2023-07-02 LAB — URINALYSIS, ROUTINE W REFLEX MICROSCOPIC
Bilirubin, UA: NEGATIVE
Glucose, UA: NEGATIVE
Ketones, UA: NEGATIVE
Leukocytes,UA: NEGATIVE
Nitrite, UA: NEGATIVE
Protein,UA: NEGATIVE
Specific Gravity, UA: 1.025 (ref 1.005–1.030)
Urobilinogen, Ur: 0.2 mg/dL (ref 0.2–1.0)
pH, UA: 5.5 (ref 5.0–7.5)

## 2023-07-02 LAB — MICROSCOPIC EXAMINATION: Bacteria, UA: NONE SEEN

## 2023-07-02 MED ORDER — CIPROFLOXACIN HCL 500 MG PO TABS
500.0000 mg | ORAL_TABLET | Freq: Once | ORAL | Status: AC
  Administered 2023-07-02: 500 mg via ORAL

## 2023-07-02 NOTE — Progress Notes (Signed)
07/02/2023 11:26 AM   Jeremy Casey January 07, 1951 [id number]  Referring provider: Elias Else, MD [phone] [address] [place],  [place] [address]  Followup elevated PSA and Nocturia  HPI: Jeremy Casey is a 71yo here for followup for elevated PSA and BPH with nocturia. PSA increased to 5.6 from 5.4. IPSS 16 QOL 3 on no BPH therapy. He tried uroxatral which caused hypotension and he had to stop the medication. When he took the medication his LUTS resolved. Nocturia 0-1x. Urine stream was strong. Now his LUTS have worsened.    PMH: Past Medical History:  Diagnosis Date   Crohn's disease (HCC)    Elevated PSA    Essential hypertension, benign    [facility]    Surgical History: Past Surgical History:  Procedure Laterality Date   HIP SURGERY     KNEE SURGERY     MOUTH SURGERY  2014   repeat from root canal, 2 teeth pulled   PE tube placement  2002   secondary to serous otitis   ROOT CANAL  2013   failed    Home Medications:  Allergies as of 07/02/2023       Reactions   Tamsulosin Hcl Other (See Comments)   Drop in blood pressure    Amoxicillin Other (See Comments)        Medication List        Accurate as of July 02, 2023 11:26 AM. If you have any questions, ask your nurse or doctor.          alfuzosin 10 MG 24 hr tablet Commonly known as: UROXATRAL Take 1 tablet (10 mg total) by mouth at bedtime.   aspirin 81 MG chewable tablet 1 tablet   Breo Ellipta 200-25 MCG/INH Aepb Generic drug: fluticasone furoate-vilanterol   ciprofloxacin-dexamethasone OTIC suspension Commonly known as: Ciprodex Place 4 drops into the right ear 2 (two) times daily.   clindamycin 1 % gel Commonly known as: CLINDAGEL   doxycycline 100 MG capsule Commonly known as: VIBRAMYCIN Take 1 capsule (100 mg total) by mouth every 12 (twelve) hours.   fluticasone 0.05 % cream Commonly known as: CUTIVATE fluticasone propionate 0.05 % topical cream  APPLY TOPICALLY TO FACE TWICE A  DAY   fluticasone 50 MCG/ACT nasal spray Commonly known as: FLONASE Place into both nostrils daily.   ipratropium 0.03 % nasal spray Commonly known as: ATROVENT Place 2 sprays into both nostrils 3 (three) times daily.   olmesartan-hydrochlorothiazide 20-12.5 MG tablet Commonly known as: BENICAR HCT Take 0.5 tablets by mouth daily.        Allergies:  Allergies  Allergen Reactions   Tamsulosin Hcl Other (See Comments)    Drop in blood pressure    Amoxicillin Other (See Comments)    Family History: Family History  Problem Relation Age of Onset   Hodgkin's lymphoma Father    Cancer Father    ALS Mother    Hypertension Brother    Heart attack Unknown     Social History:  reports that he has never smoked. He has never used smokeless tobacco. He reports current alcohol use. He reports that he does not use drugs.  ROS: All other review of systems were reviewed and are negative except what is noted above in HPI  Physical Exam: BP (!) 158/78   Pulse 80   Ht 5\' 7"  (1.702 m)   Wt 198 lb (89.8 kg)   BMI 31.01 kg/m   Constitutional:  Alert and oriented, No acute  distress. HEENT: [place] AT, moist mucus membranes.  Trachea midline, no masses. Cardiovascular: No clubbing, cyanosis, or edema. Respiratory: Normal respiratory effort, no increased work of breathing. GI: Abdomen is soft, nontender, nondistended, no abdominal masses GU: No CVA tenderness.  Lymph: No cervical or inguinal lymphadenopathy. Skin: No rashes, bruises or suspicious lesions. Neurologic: Grossly intact, no focal deficits, moving all 4 extremities. Psychiatric: Normal mood and affect.  Laboratory Data: Lab Results  Component Value Date   WBC 7.3 01/26/2022   HGB 15.1 01/26/2022   HCT 45.2 01/26/2022   MCV 91 01/26/2022   PLT 195 10/16/2016    Lab Results  Component Value Date   CREATININE 1.14 10/16/2016    No results found for: "PSA"  No results found for: "TESTOSTERONE"  No results found  for: "HGBA1C"  Urinalysis    Component Value Date/Time   COLORURINE YELLOW 10/16/2016 2320   APPEARANCEUR Clear 03/30/2023 0839   LABSPEC 1.010 10/16/2016 2320   PHURINE 5.5 10/16/2016 2320   GLUCOSEU Negative 03/30/2023 0839   HGBUR TRACE (A) 10/16/2016 2320   BILIRUBINUR Negative 03/30/2023 0839   KETONESUR 40 (A) 10/16/2016 2320   PROTEINUR Negative 03/30/2023 0839   PROTEINUR NEGATIVE 10/16/2016 2320   NITRITE Negative 03/30/2023 0839   NITRITE NEGATIVE 10/16/2016 2320   LEUKOCYTESUR Negative 03/30/2023 0839    Lab Results  Component Value Date   LABMICR See below: 03/30/2023   WBCUA 0-5 03/30/2023   LABEPIT None seen 03/30/2023   MUCUS Present 03/30/2023   BACTERIA None seen 03/30/2023    Pertinent Imaging:  No results found for this or any previous visit.  No results found for this or any previous visit.  No results found for this or any previous visit.  No results found for this or any previous visit.  No results found for this or any previous visit.  No valid procedures specified. No results found for this or any previous visit.  No results found for this or any previous visit.   Assessment & Plan:    1. Elevated prostate specific antigen (PSA) -followup 6 months with a PSA.  - Urinalysis, Routine w reflex microscopic  2. Nocturia We discussed the management of his BPH including continued medical therapy, Rezum, Urolift, TURP and simple prostatectomy. After discussing the options the patient has elected to proceed with Urolift. Risks/benefits/alternatives discussed.    No follow-ups on file.  Jeremy Aye, MD  [facility] [place]    Cystoscopy Procedure Note  Patient identification was confirmed, informed consent was obtained, and patient was prepped using Betadine solution.  Lidocaine jelly was administered per urethral meatus.     Pre-Procedure: - Inspection reveals a normal caliber ureteral meatus.  Procedure: The  flexible cystoscope was introduced without difficulty - No urethral strictures/lesions are present. - Enlarged prostate no median lobe - Normal bladder neck - Bilateral ureteral orifices identified - Bladder mucosa  reveals no ulcers, tumors, or lesions - No bladder stones - No trabeculation     Post-Procedure: - Patient tolerated the procedure well  Assessment/ Plan: We discussed the management of his BPH including continued medical therapy, Rezum, Urolift, TURP and simple prostatectomy. After discussing the options the patient has elected to proceed with Urolift. Risks/benefits/alternatives discussed.   No follow-ups on file.  Jeremy Aye, MD

## 2023-07-10 ENCOUNTER — Encounter: Payer: Self-pay | Admitting: Urology

## 2023-07-10 NOTE — Patient Instructions (Signed)

## 2023-08-11 ENCOUNTER — Ambulatory Visit
Admission: EM | Admit: 2023-08-11 | Discharge: 2023-08-11 | Disposition: A | Discharge status: Home / Self Care | Payer: Medicare HMO | Attending: Family Medicine | Admitting: Family Medicine

## 2023-08-11 ENCOUNTER — Other Ambulatory Visit: Payer: Self-pay

## 2023-08-11 DIAGNOSIS — N390 Urinary tract infection, site not specified: Secondary | ICD-10-CM

## 2023-08-11 DIAGNOSIS — N401 Enlarged prostate with lower urinary tract symptoms: Secondary | ICD-10-CM | POA: Diagnosis not present

## 2023-08-11 LAB — POCT URINALYSIS DIP (MANUAL ENTRY)
Bilirubin, UA: NEGATIVE
Glucose, UA: NEGATIVE mg/dL
Ketones, POC UA: NEGATIVE mg/dL
Leukocytes, UA: NEGATIVE
Nitrite, UA: NEGATIVE
Protein Ur, POC: NEGATIVE mg/dL
Spec Grav, UA: >=1.03 — AB (ref 1.010–1.025)
Urobilinogen, UA: 0.2 E.U./dL
pH, UA: 6 (ref 5.0–8.0)

## 2023-08-11 MED ORDER — SULFAMETHOXAZOLE-TRIMETHOPRIM 800-160 MG PO TABS
1.0000 | ORAL_TABLET | Freq: Two times a day (BID) | ORAL | 0 refills | Status: AC
Start: 2023-08-11 — End: 2023-08-18

## 2023-08-11 NOTE — ED Provider Notes (Signed)
[facility]    CSN: [id number] Arrival date & time: 08/11/23  1311      History   Chief Complaint No chief complaint on file.   HPI Jeremy Casey is a 72 y.o. male.   HPI  Pleasant 72 year old gentleman who is under the care of Dr. Wilkie Aye for prostate enlargement.  He is scheduled for a UroLift later this month.  He is here because he has burning with urination, frequency, and "throbbing prostate" since yesterday.  No fever or chills.  No abdominal pain.  No flank pain.  Past Medical History:  Diagnosis Date   Crohn's disease (HCC)    Elevated PSA    Essential hypertension, benign    [facility]    Patient Active Problem List   Diagnosis Date Noted   Recurrent sinusitis 01/26/2022   Nonallergic rhinitis 01/26/2022   Elevated prostate specific antigen (PSA) 10/27/2020   Nocturia 10/27/2020    Past Surgical History:  Procedure Laterality Date   HIP SURGERY     KNEE SURGERY     MOUTH SURGERY  2014   repeat from root canal, 2 teeth pulled   PE tube placement  2002   secondary to serous otitis   ROOT CANAL  2013   failed       Home Medications    Prior to Admission medications   Medication Sig Start Date End Date Taking? Authorizing Provider  sulfamethoxazole-trimethoprim (BACTRIM DS) 800-160 MG tablet Take 1 tablet by mouth 2 (two) times daily for 7 days. 08/11/23 08/18/23 Yes Eustace Moore, MD  aspirin 81 MG chewable tablet 1 tablet    [provider]  clindamycin (CLINDAGEL) 1 % gel     [provider]  fluticasone (FLONASE) 50 MCG/ACT nasal spray Place into both nostrils daily.    [provider]  olmesartan-hydrochlorothiazide (BENICAR HCT) 20-12.5 MG per tablet Take 0.5 tablets by mouth daily.     [provider]    Family History Family History  Problem Relation Age of Onset   Hodgkin's lymphoma Father    Cancer Father    ALS Mother    Hypertension Brother    Heart attack Unknown      Social History Social History   Tobacco Use   Smoking status: Never   Smokeless tobacco: Never  Substance Use Topics   Alcohol use: Yes   Drug use: No     Allergies   Tamsulosin hcl and Amoxicillin   Review of Systems Review of Systems  See HPI Physical Exam Triage Vital Signs ED Triage Vitals  Encounter Vitals Group     BP 08/11/23 1319 (!) 145/84     Systolic BP Percentile --      Diastolic BP Percentile --      Pulse Rate 08/11/23 1319 94     Resp 08/11/23 1319 16     Temp 08/11/23 1319 98.3 F (36.8 C)     Temp Source 08/11/23 1319 Oral     SpO2 08/11/23 1319 98 %     Weight --      Height --      Head Circumference --      Peak Flow --      Pain Score 08/11/23 1321 5     Pain Loc --      Pain Education --      Exclude from Growth Chart --    No data found.  Updated Vital Signs BP (!) 145/84 (BP Location: Left  Arm)   Pulse 94   Temp 98.3 F (36.8 C) (Oral)   Resp 16   SpO2 98%      Physical Exam Constitutional:      General: He is not in acute distress.    Appearance: He is well-developed and normal weight.  HENT:     Head: Normocephalic and atraumatic.  Eyes:     Conjunctiva/sclera: Conjunctivae normal.     Pupils: Pupils are equal, round, and reactive to light.  Cardiovascular:     Rate and Rhythm: Normal rate.  Pulmonary:     Effort: Pulmonary effort is normal. No respiratory distress.  Abdominal:     General: There is no distension.     Palpations: Abdomen is soft.     Tenderness: There is no right CVA tenderness or left CVA tenderness.  Musculoskeletal:        General: Normal range of motion.     Cervical back: Normal range of motion.  Skin:    General: Skin is warm and dry.  Neurological:     Mental Status: He is alert.      UC Treatments / Results  Labs (all labs ordered are listed, but only abnormal results are displayed) Labs Reviewed  POCT URINALYSIS DIP (MANUAL ENTRY) - Abnormal; Notable for the following  components:      Result Value   Spec Grav, UA >=1.030 (*)    Blood, UA trace-intact (*)    All other components within normal limits  URINE CULTURE    EKG   Radiology No results found.  Procedures Procedures (including critical care time)  Medications Ordered in UC Medications - No data to display  Initial Impression / Assessment and Plan / UC Course  I have reviewed the triage vital signs and the nursing notes.  Pertinent labs & imaging results that were available during my care of the patient were reviewed by me and considered in my medical decision making (see chart for details).     Final Clinical Impressions(s) / UC Diagnoses   Final diagnoses:  Lower urinary tract infection  Benign prostatic hyperplasia with lower urinary tract symptoms, symptom details unspecified     Discharge Instructions      Take the antibiotic 2 x a day Drink lots of water I have sent the urine sample for culture You will be notified if any change in therapy is needed   ED Prescriptions     Medication Sig Dispense Auth. Provider   sulfamethoxazole-trimethoprim (BACTRIM DS) 800-160 MG tablet Take 1 tablet by mouth 2 (two) times daily for 7 days. 14 tablet Eustace Moore, MD      PDMP not reviewed this encounter.   Eustace Moore, MD 08/11/23 1340

## 2023-08-11 NOTE — Discharge Instructions (Signed)
Take the antibiotic 2 x a day Drink lots of water I have sent the urine sample for culture You will be notified if any change in therapy is needed

## 2023-08-11 NOTE — ED Triage Notes (Signed)
Burning urination and prostate throbbing while urinating since yesterday

## 2023-08-12 LAB — URINE CULTURE
Culture: NO GROWTH
Special Requests: NORMAL

## 2023-08-31 DIAGNOSIS — K5090 Crohn's disease, unspecified, without complications: Secondary | ICD-10-CM | POA: Diagnosis not present

## 2023-08-31 DIAGNOSIS — Z1331 Encounter for screening for depression: Secondary | ICD-10-CM | POA: Diagnosis not present

## 2023-08-31 DIAGNOSIS — I10 Essential (primary) hypertension: Secondary | ICD-10-CM | POA: Diagnosis not present

## 2023-08-31 DIAGNOSIS — R972 Elevated prostate specific antigen [PSA]: Secondary | ICD-10-CM | POA: Diagnosis not present

## 2023-08-31 DIAGNOSIS — E7800 Pure hypercholesterolemia, unspecified: Secondary | ICD-10-CM | POA: Diagnosis not present

## 2023-08-31 DIAGNOSIS — Z1211 Encounter for screening for malignant neoplasm of colon: Secondary | ICD-10-CM | POA: Diagnosis not present

## 2023-08-31 DIAGNOSIS — N1831 Chronic kidney disease, stage 3a: Secondary | ICD-10-CM | POA: Diagnosis not present

## 2023-08-31 DIAGNOSIS — Z0000 Encounter for general adult medical examination without abnormal findings: Secondary | ICD-10-CM | POA: Diagnosis not present

## 2023-09-04 NOTE — Patient Instructions (Signed)
Jeremy Casey  09/04/2023     @PREFPERIOPPHARMACY @   Your procedure is scheduled on  09/10/2023.   Report to [facility] at  0600  A.M.   Call this number if you have problems the morning of surgery:  [phone]  If you experience any cold or flu symptoms such as cough, fever, chills, shortness of breath, etc. between now and your scheduled surgery, please notify us at the above number.   Remember:  Do not eat or drink after midnight.      Take these medicines the morning of surgery with A SIP OF WATER                                              Claritin.     Do not wear jewelry, make-up or nail polish, including gel polish,  artificial nails, or any other type of covering on natural nails (fingers and  toes).  Do not wear lotions, powders, or perfumes, or deodorant.  Do not shave 48 hours prior to surgery.  Men may shave face and neck.  Do not bring valuables to the hospital.  [facility] is not responsible for any belongings or valuables.  Contacts, dentures or bridgework may not be worn into surgery.  Leave your suitcase in the car.  After surgery it may be brought to your room.  For patients admitted to the hospital, discharge time will be determined by your treatment team.  Patients discharged the day of surgery will not be allowed to drive home and must have someone with them for 24 hours.    Special instructions:   DO NOT smoke tobacco or vape for 24 hours before your procedure.  Please read over the following fact sheets that you were given. Anesthesia Post-op Instructions and Care and Recovery After Surgery       Prostatic Urethral Lift, Care After The following information offers guidance on how to care for yourself after your procedure. Your health care provider may also give you more specific instructions. If you have problems or questions, contact your health care provider. What can I expect after the procedure? After the procedure, it is  common to have: Soreness or discomfort in your penis from having the cystoscope inserted during the procedure. Discomfort or burning when urinating. An increased urge to urinate. More frequent urination. Urine that is blood-tinged. These symptoms should go away after a few days. Follow these instructions at home: Activity  If you were given a sedative during the procedure, it can affect you for several hours. Do not drive or operate machinery until your health care provider says that it is safe. Avoid sitting for a long time without moving. Get up to take short walks every 1-2 hours. This is important to improve blood flow and breathing. Ask for help if you feel weak or unsteady. You may have to avoid lifting. Ask your health care provider how much you can safely lift. Avoid intense physical activity for as long as told by your health care provider. Return to your normal activities as told by your health care provider. Ask your health care provider what activities are safe for you. Ask when you can return to sexual activity. General instructions  Take over-the-counter and prescription medicines only as told by your health care provider. Ask your health care provider  if the medicine prescribed to you: Requires you to avoid driving or using machinery. Can cause constipation. You may need to take these actions to prevent or treat constipation: Drink enough fluid to keep your urine pale yellow. Take over-the-counter or prescription medicines. Eat foods that are high in fiber, such as beans, whole grains, and fresh fruits and vegetables. Limit foods that are high in fat and processed sugars, such as fried or sweet foods. Do not use any products that contain nicotine or tobacco. These products include cigarettes, chewing tobacco, and vaping devices, such as e-cigarettes. These can delay healing after the procedure. If you need help quitting, ask your health care provider. Keep all follow-up  visits. This is important. Contact a health care provider if: You have chills or a fever. You have pain when passing urine. You have bright red blood or blood clots in your urine. You have difficulty passing urine. You have leaking of urine (incontinence). Get help right away if: You have chest pain or shortness of breath. You have leg pain or swelling. You cannot pass urine. These symptoms may be an emergency. Get help right away. Call 911. Do not wait to see if the symptoms will go away. Do not drive yourself to the hospital. Summary After the procedure, it is common to have discomfort or burning when urinating, an increased urge to urinate, more frequent urination, and urine that is blood-tinged. You may have to avoid lifting. Ask your health care provider how much you can safely lift. Return to your normal activities as told by your health care provider. Ask when you can return to sexual activity. This information is not intended to replace advice given to you by your health care provider. Make sure you discuss any questions you have with your health care provider. Document Revised: 06/24/2021 Document Reviewed: 06/24/2021 Elsevier Patient Education  2024 [employer] General Anesthesia, Adult, Care After The following information offers guidance on how to care for yourself after your procedure. Your health care provider may also give you more specific instructions. If you have problems or questions, contact your health care provider. What can I expect after the procedure? After the procedure, it is common for people to: Have pain or discomfort at the IV site. Have nausea or vomiting. Have a sore throat or hoarseness. Have trouble concentrating. Feel cold or chills. Feel weak, sleepy, or tired (fatigue). Have soreness and body aches. These can affect parts of the body that were not involved in surgery. Follow these instructions at home: For the time period you were told by your  health care provider:  Rest. Do not participate in activities where you could fall or become injured. Do not drive or use machinery. Do not drink alcohol. Do not take sleeping pills or medicines that cause drowsiness. Do not make important decisions or sign legal documents. Do not take care of children on your own. General instructions Drink enough fluid to keep your urine pale yellow. If you have sleep apnea, surgery and certain medicines can increase your risk for breathing problems. Follow instructions from your health care provider about wearing your sleep device: Anytime you are sleeping, including during daytime naps. While taking prescription pain medicines, sleeping medicines, or medicines that make you drowsy. Return to your normal activities as told by your health care provider. Ask your health care provider what activities are safe for you. Take over-the-counter and prescription medicines only as told by your health care provider. Do not use any products that  contain nicotine or tobacco. These products include cigarettes, chewing tobacco, and vaping devices, such as e-cigarettes. These can delay incision healing after surgery. If you need help quitting, ask your health care provider. Contact a health care provider if: You have nausea or vomiting that does not get better with medicine. You vomit every time you eat or drink. You have pain that does not get better with medicine. You cannot urinate or have bloody urine. You develop a skin rash. You have a fever. Get help right away if: You have trouble breathing. You have chest pain. You vomit blood. These symptoms may be an emergency. Get help right away. Call 911. Do not wait to see if the symptoms will go away. Do not drive yourself to the hospital. Summary After the procedure, it is common to have a sore throat, hoarseness, nausea, vomiting, or to feel weak, sleepy, or fatigue. For the time period you were told by your  health care provider, do not drive or use machinery. Get help right away if you have difficulty breathing, have chest pain, or vomit blood. These symptoms may be an emergency. This information is not intended to replace advice given to you by your health care provider. Make sure you discuss any questions you have with your health care provider. Document Revised: 02/24/2022 Document Reviewed: 02/24/2022 Elsevier Patient Education  2024 [employer] Inc. How to Use Chlorhexidine Before Surgery Chlorhexidine gluconate (CHG) is a germ-killing (antiseptic) solution that is used to clean the skin. It can get rid of the bacteria that normally live on the skin and can keep them away for about 24 hours. To clean your skin with CHG, you may be given: A CHG solution to use in the shower or as part of a sponge bath. A prepackaged cloth that contains CHG. Cleaning your skin with CHG may help lower the risk for infection: While you are staying in the intensive care unit of the hospital. If you have a vascular access, such as a central line, to provide short-term or long-term access to your veins. If you have a catheter to drain urine from your bladder. If you are on a ventilator. A ventilator is a machine that helps you breathe by moving air in and out of your lungs. After surgery. What are the risks? Risks of using CHG include: A skin reaction. Hearing loss, if CHG gets in your ears and you have a perforated eardrum. Eye injury, if CHG gets in your eyes and is not rinsed out. The CHG product catching fire. Make sure that you avoid smoking and flames after applying CHG to your skin. Do not use CHG: If you have a chlorhexidine allergy or have previously reacted to chlorhexidine. On babies younger than 58 months of age. How to use CHG solution Use CHG only as told by your health care provider, and follow the instructions on the label. Use the full amount of CHG as directed. Usually, this is one  bottle. During a shower Follow these steps when using CHG solution during a shower (unless your health care provider gives you different instructions): Start the shower. Use your normal soap and shampoo to wash your face and hair. Turn off the shower or move out of the shower stream. Pour the CHG onto a clean washcloth. Do not use any type of brush or rough-edged sponge. Starting at your neck, lather your body down to your toes. Make sure you follow these instructions: If you will be having surgery, pay special attention to  the part of your body where you will be having surgery. Scrub this area for at least 1 minute. Do not use CHG on your head or face. If the solution gets into your ears or eyes, rinse them well with water. Avoid your genital area. Avoid any areas of skin that have broken skin, cuts, or scrapes. Scrub your back and under your arms. Make sure to wash skin folds. Let the lather sit on your skin for 1-2 minutes or as long as told by your health care provider. Thoroughly rinse your entire body in the shower. Make sure that all body creases and crevices are rinsed well. Dry off with a clean towel. Do not put any substances on your body afterward--such as powder, lotion, or perfume--unless you are told to do so by your health care provider. Only use lotions that are recommended by the manufacturer. Put on clean clothes or pajamas. If it is the night before your surgery, sleep in clean sheets.  During a sponge bath Follow these steps when using CHG solution during a sponge bath (unless your health care provider gives you different instructions): Use your normal soap and shampoo to wash your face and hair. Pour the CHG onto a clean washcloth. Starting at your neck, lather your body down to your toes. Make sure you follow these instructions: If you will be having surgery, pay special attention to the part of your body where you will be having surgery. Scrub this area for at least 1  minute. Do not use CHG on your head or face. If the solution gets into your ears or eyes, rinse them well with water. Avoid your genital area. Avoid any areas of skin that have broken skin, cuts, or scrapes. Scrub your back and under your arms. Make sure to wash skin folds. Let the lather sit on your skin for 1-2 minutes or as long as told by your health care provider. Using a different clean, wet washcloth, thoroughly rinse your entire body. Make sure that all body creases and crevices are rinsed well. Dry off with a clean towel. Do not put any substances on your body afterward--such as powder, lotion, or perfume--unless you are told to do so by your health care provider. Only use lotions that are recommended by the manufacturer. Put on clean clothes or pajamas. If it is the night before your surgery, sleep in clean sheets. How to use CHG prepackaged cloths Only use CHG cloths as told by your health care provider, and follow the instructions on the label. Use the CHG cloth on clean, dry skin. Do not use the CHG cloth on your head or face unless your health care provider tells you to. When washing with the CHG cloth: Avoid your genital area. Avoid any areas of skin that have broken skin, cuts, or scrapes. Before surgery Follow these steps when using a CHG cloth to clean before surgery (unless your health care provider gives you different instructions): Using the CHG cloth, vigorously scrub the part of your body where you will be having surgery. Scrub using a back-and-forth motion for 3 minutes. The area on your body should be completely wet with CHG when you are done scrubbing. Do not rinse. Discard the cloth and let the area air-dry. Do not put any substances on the area afterward, such as powder, lotion, or perfume. Put on clean clothes or pajamas. If it is the night before your surgery, sleep in clean sheets.  For general bathing Follow these steps when  using CHG cloths for general  bathing (unless your health care provider gives you different instructions). Use a separate CHG cloth for each area of your body. Make sure you wash between any folds of skin and between your fingers and toes. Wash your body in the following order, switching to a new cloth after each step: The front of your neck, shoulders, and chest. Both of your arms, under your arms, and your hands. Your stomach and groin area, avoiding the genitals. Your right leg and foot. Your left leg and foot. The back of your neck, your back, and your buttocks. Do not rinse. Discard the cloth and let the area air-dry. Do not put any substances on your body afterward--such as powder, lotion, or perfume--unless you are told to do so by your health care provider. Only use lotions that are recommended by the manufacturer. Put on clean clothes or pajamas. Contact a health care provider if: Your skin gets irritated after scrubbing. You have questions about using your solution or cloth. You swallow any chlorhexidine. Call your local poison control center [phone] in the [place] Get help right away if: Your eyes itch badly, or they become very red or swollen. Your skin itches badly and is red or swollen. Your hearing changes. You have trouble seeing. You have swelling or tingling in your mouth or throat. You have trouble breathing. These symptoms may represent a serious problem that is an emergency. Do not wait to see if the symptoms will go away. Get medical help right away. Call your local emergency services (911 in the [place] Do not drive yourself to the hospital. Summary Chlorhexidine gluconate (CHG) is a germ-killing (antiseptic) solution that is used to clean the skin. Cleaning your skin with CHG may help to lower your risk for infection. You may be given CHG to use for bathing. It may be in a bottle or in a prepackaged cloth to use on your skin. Carefully follow your health care provider's instructions and the  instructions on the product label. Do not use CHG if you have a chlorhexidine allergy. Contact your health care provider if your skin gets irritated after scrubbing. This information is not intended to replace advice given to you by your health care provider. Make sure you discuss any questions you have with your health care provider. Document Revised: 03/27/2022 Document Reviewed: 02/07/2021 Elsevier Patient Education  2023 [employer].

## 2023-09-05 ENCOUNTER — Other Ambulatory Visit: Payer: Self-pay

## 2023-09-05 ENCOUNTER — Encounter (HOSPITAL_COMMUNITY): Payer: Self-pay

## 2023-09-05 ENCOUNTER — Encounter (HOSPITAL_COMMUNITY)
Admission: RE | Admit: 2023-09-05 | Discharge: 2023-09-05 | Disposition: A | Discharge status: Home / Self Care | Payer: Medicare HMO | Source: Ambulatory Visit | Attending: Urology | Admitting: Urology

## 2023-09-05 VITALS — BP 139/73 | HR 78 | Temp 97.6°F | Resp 18 | Ht 67.0 in | Wt 198.0 lb

## 2023-09-05 DIAGNOSIS — I10 Essential (primary) hypertension: Secondary | ICD-10-CM | POA: Insufficient documentation

## 2023-09-05 DIAGNOSIS — Z79899 Other long term (current) drug therapy: Secondary | ICD-10-CM | POA: Insufficient documentation

## 2023-09-05 DIAGNOSIS — Z01818 Encounter for other preprocedural examination: Secondary | ICD-10-CM | POA: Insufficient documentation

## 2023-09-05 DIAGNOSIS — E876 Hypokalemia: Secondary | ICD-10-CM

## 2023-09-05 HISTORY — DX: Other seasonal allergic rhinitis: J30.2

## 2023-09-06 DIAGNOSIS — Z8589 Personal history of malignant neoplasm of other organs and systems: Secondary | ICD-10-CM | POA: Insufficient documentation

## 2023-09-06 DIAGNOSIS — K648 Other hemorrhoids: Secondary | ICD-10-CM | POA: Insufficient documentation

## 2023-09-06 DIAGNOSIS — R030 Elevated blood-pressure reading, without diagnosis of hypertension: Secondary | ICD-10-CM | POA: Insufficient documentation

## 2023-09-06 NOTE — Progress Notes (Unsigned)
Name: Jeremy Casey DOB: [birth year] MRN: [id number]  Diagnoses: Post-operative state  HPI: Jeremy Casey presents post-operatively. GU History: 1. BPH with LUTS (nocturia).  - Uroxatral caused hypotension. 2. Elevated PSA.  Today He presents s/p the following procedures by Dr. Ronne Binning on 09/10/2023:  PREOPERATIVE DIAGNOSIS:  Benign prostatic hypertrophy with bladder outlet obstruction.   POSTOPERATIVE DIAGNOSIS:  Benign prostatic hypertrophy with bladder outlet obstruction.   PROCEDURE:  Cystoscopy with implantation of UroLift devices, 6 implants.  Postop course: Yesterday (09/11/2023):  - Seen in ER for leaking around catheter, bloody discharge, and catheter not draining properly. Foley catheter was unable to be irrigated and was therefore replaced (without difficulty). His bladder was then irrigated to clear via the new catheter with return of small blood clots. His creatinine was mildly elevated (1.43) and he had mild leukocytosis (WBC 13.3).   Today: He reports the catheter is draining well.  He denies gross hematuria.  He denies fevers.   Fall Screening: Do you usually have a device to assist in your mobility? No   Medications: Current Outpatient Medications  Medication Sig Dispense Refill   aspirin 81 MG chewable tablet 1 tablet     clindamycin (CLINDAGEL) 1 % gel Apply 1 Application topically daily as needed (pre skin cancer).     fluticasone (CUTIVATE) 0.05 % cream Apply 1 Application topically daily as needed (pre cancer on face).     fluticasone (FLONASE) 50 MCG/ACT nasal spray Place 2 sprays into both nostrils daily.     loratadine (CLARITIN) 10 MG tablet Take 10 mg by mouth daily.     metroNIDAZOLE (METROCREAM) 0.75 % cream Apply 1 Application topically daily as needed (pre cancer).     olmesartan-hydrochlorothiazide (BENICAR HCT) 20-12.5 MG per tablet Take 0.5 tablets by mouth daily.      sulfamethoxazole-trimethoprim (BACTRIM DS) 800-160 MG tablet Take 1  tablet by mouth every 12 (twelve) hours for 5 days. 10 tablet 0   traMADol (ULTRAM) 50 MG tablet Take 1 tablet (50 mg total) by mouth every 6 (six) hours as needed. 15 tablet 0   Current Facility-Administered Medications  Medication Dose Route Frequency Provider Last Rate Last Admin   ciprofloxacin (CIPRO) tablet 500 mg  500 mg Oral Once Donnita Falls, FNP        Allergies: Allergies  Allergen Reactions   Tamsulosin Hcl Other (See Comments)    Drop in blood pressure    Amoxicillin Other (See Comments)    Gi- Upset    Past Medical History:  Diagnosis Date   Crohn's disease (HCC)    Elevated PSA    Essential hypertension, benign    [facility]   Seasonal allergies    Past Surgical History:  Procedure Laterality Date   HIP SURGERY     KNEE SURGERY     MOUTH SURGERY  2014   repeat from root canal, 2 teeth pulled   PE tube placement  2002   secondary to serous otitis   ROOT CANAL  2013   failed   Family History  Problem Relation Age of Onset   Hodgkin's lymphoma Father    Cancer Father    ALS Mother    Hypertension Brother    Heart attack Unknown    Social History   Socioeconomic History   Marital status: Married    Spouse name: Not on file   Number of children: 2   Years of education: Not on file   Highest education level: Not on file  Occupational History   Not on file  Tobacco Use   Smoking status: Never   Smokeless tobacco: Never  Substance and Sexual Activity   Alcohol use: Not Currently   Drug use: No   Sexual activity: Not on file  Other Topics Concern   Not on file  Social History Narrative   Not on file   Social Determinants of Health   Financial Resource Strain: Not on file  Food Insecurity: Not on file  Transportation Needs: Not on file  Physical Activity: Not on file  Stress: Not on file  Social Connections: Unknown (09/11/2023)   Received from [facility]   Social Network    Social Network: Not on file  Intimate Partner Violence: Not  At Risk (09/12/2023)   Received from [facility]   HITS    Over the last 12 months how often did your partner physically hurt you?: 1    Over the last 12 months how often did your partner insult you or talk down to you?: 1    Over the last 12 months how often did your partner threaten you with physical harm?: 1    Over the last 12 months how often did your partner scream or curse at you?: 1    SUBJECTIVE  Review of Systems Constitutional: Patient denies any unintentional weight loss or change in strength lntegumentary: Patient denies any rashes or pruritus Cardiovascular: Patient denies chest pain or syncope Respiratory: Patient denies shortness of breath Gastrointestinal: Patient denies nausea, vomiting, constipation, or diarrhea Musculoskeletal: Patient denies muscle cramps or weakness Neurologic: Patient denies convulsions or seizures Psychiatric: Patient denies memory problems Allergic/Immunologic: Patient denies recent allergic reaction(s) Hematologic/Lymphatic: Patient denies bleeding tendencies Endocrine: Patient denies heat/cold intolerance  GU: As per HPI.  OBJECTIVE Vitals:   09/12/23 0841  BP: (!) 161/88  Pulse: 85  Temp: 98.5 F (36.9 C)   Body mass index is 31 kg/m.  Physical Examination Constitutional: No obvious distress; patient is non-toxic appearing  Cardiovascular: No visible lower extremity edema.  Respiratory: The patient does not have audible wheezing/stridor; respirations do not appear labored  Gastrointestinal: Abdomen non-distended Musculoskeletal: Normal ROM of UEs  Skin: No obvious rashes/open sores  Neurologic: CN 2-12 grossly intact Psychiatric: Answered questions appropriately with normal affect  Hematologic/Lymphatic/Immunologic: No obvious bruises or sites of spontaneous bleeding  GU: Catheter draining clear yellow urine.  ASSESSMENT Postop check - Plan: Bladder Voiding Trial, ciprofloxacin (CIPRO) tablet 500 mg,  sulfamethoxazole-trimethoprim (BACTRIM DS) 800-160 MG tablet  Benign prostatic hyperplasia with nocturia - Plan: sulfamethoxazole-trimethoprim (BACTRIM DS) 800-160 MG tablet  Elevated prostate specific antigen (PSA) - Plan: sulfamethoxazole-trimethoprim (BACTRIM DS) 800-160 MG tablet  Leukocytosis, unspecified type - Plan: sulfamethoxazole-trimethoprim (BACTRIM DS) 800-160 MG tablet  We reviewed the operative procedures and findings. Voiding trial was done however patient experienced bladder spasms therefore it was difficult to assess results. Foley catheter to remain out - patient was instructed to return this afternoon for PVR check.   Will prescribe Bactrim to cover for possible catheter-associated UTI given finding of leukocytosis in ER last night. He has tolerated Bactrim well within the past year.  Will plan for follow up in 1 month with Dr. Ronne Binning or sooner if needed. Pt verbalized understanding and agreement. All questions were answered.  PLAN Advised the following: Foley catheter discontinued; PVR check later today. Bactrim DS 2x/day for 5 days. Return in about 4 weeks (around 10/10/2023) for f/u with Dr. Ronne Binning.   Orders Placed This Encounter  Procedures  Bladder Voiding Trial    It has been explained that the patient is to follow regularly with their PCP in addition to all other providers involved in their care and to follow instructions provided by these respective offices. Patient advised to contact urology clinic if any urologic-pertaining questions, concerns, new symptoms or problems arise in the interim period.  There are no Patient Instructions on file for this visit.  Electronically signed by:  Donnita Falls, MSN, FNP-C, CUNP 09/12/2023 9:34 AM

## 2023-09-10 ENCOUNTER — Ambulatory Visit (HOSPITAL_COMMUNITY)
Admission: RE | Admit: 2023-09-10 | Discharge: 2023-09-10 | Disposition: A | Discharge status: Home / Self Care | Payer: Medicare HMO | Attending: Urology | Admitting: Urology

## 2023-09-10 ENCOUNTER — Encounter (HOSPITAL_COMMUNITY)
Admission: RE | Disposition: A | Discharge status: Home / Self Care | Payer: Self-pay | Source: Home / Self Care | Attending: Urology

## 2023-09-10 ENCOUNTER — Ambulatory Visit (HOSPITAL_BASED_OUTPATIENT_CLINIC_OR_DEPARTMENT_OTHER): Payer: Medicare HMO | Admitting: Certified Registered"

## 2023-09-10 ENCOUNTER — Ambulatory Visit (HOSPITAL_COMMUNITY): Payer: Medicare HMO | Admitting: Certified Registered"

## 2023-09-10 ENCOUNTER — Encounter (HOSPITAL_COMMUNITY): Payer: Self-pay | Admitting: Urology

## 2023-09-10 DIAGNOSIS — E876 Hypokalemia: Secondary | ICD-10-CM

## 2023-09-10 DIAGNOSIS — N401 Enlarged prostate with lower urinary tract symptoms: Secondary | ICD-10-CM | POA: Insufficient documentation

## 2023-09-10 DIAGNOSIS — I10 Essential (primary) hypertension: Secondary | ICD-10-CM | POA: Diagnosis not present

## 2023-09-10 DIAGNOSIS — N138 Other obstructive and reflux uropathy: Secondary | ICD-10-CM | POA: Insufficient documentation

## 2023-09-10 DIAGNOSIS — N320 Bladder-neck obstruction: Secondary | ICD-10-CM

## 2023-09-10 HISTORY — PX: CYSTOSCOPY WITH INSERTION OF UROLIFT: SHX6678

## 2023-09-10 LAB — BASIC METABOLIC PANEL
Anion gap: 9 (ref 5–15)
BUN: 23 mg/dL (ref 8–23)
CO2: 25 mmol/L (ref 22–32)
Calcium: 8.5 mg/dL — ABNORMAL LOW (ref 8.9–10.3)
Chloride: 104 mmol/L (ref 98–111)
Creatinine, Ser: 1.19 mg/dL (ref 0.61–1.24)
GFR, Estimated: >60 mL/min (ref >60)
Glucose, Bld: 107 mg/dL — ABNORMAL HIGH (ref 70–99)
Potassium: 3.8 mmol/L (ref 3.5–5.1)
Sodium: 138 mmol/L (ref 135–145)

## 2023-09-10 SURGERY — CYSTOSCOPY WITH INSERTION OF UROLIFT
Anesthesia: General

## 2023-09-10 MED ORDER — GLYCOPYRROLATE PF 0.2 MG/ML IJ SOSY
PREFILLED_SYRINGE | INTRAMUSCULAR | Status: AC
  Filled 2023-09-10: qty 1

## 2023-09-10 MED ORDER — ONDANSETRON HCL 4 MG/2ML IJ SOLN
INTRAMUSCULAR | Status: AC
  Filled 2023-09-10: qty 2

## 2023-09-10 MED ORDER — LIDOCAINE 2% (20 MG/ML) 5 ML SYRINGE
INTRAMUSCULAR | Status: DC | PRN
  Administered 2023-09-10: 100 mg via INTRAVENOUS

## 2023-09-10 MED ORDER — DEXAMETHASONE SODIUM PHOSPHATE 10 MG/ML IJ SOLN
INTRAMUSCULAR | Status: AC
  Filled 2023-09-10: qty 1

## 2023-09-10 MED ORDER — FENTANYL CITRATE (PF) 100 MCG/2ML IJ SOLN
INTRAMUSCULAR | Status: AC
  Filled 2023-09-10: qty 2

## 2023-09-10 MED ORDER — CEFAZOLIN SODIUM-DEXTROSE 2-4 GM/100ML-% IV SOLN
2.0000 g | INTRAVENOUS | Status: AC
  Administered 2023-09-10: 2 g via INTRAVENOUS
  Filled 2023-09-10: qty 100

## 2023-09-10 MED ORDER — PHENYLEPHRINE 80 MCG/ML (10ML) SYRINGE FOR IV PUSH (FOR BLOOD PRESSURE SUPPORT)
PREFILLED_SYRINGE | INTRAVENOUS | Status: DC | PRN
  Administered 2023-09-10 (×2): 160 ug via INTRAVENOUS

## 2023-09-10 MED ORDER — CHLORHEXIDINE GLUCONATE 0.12 % MT SOLN
OROMUCOSAL | Status: AC
  Filled 2023-09-10: qty 15

## 2023-09-10 MED ORDER — DEXAMETHASONE SODIUM PHOSPHATE 4 MG/ML IJ SOLN
INTRAMUSCULAR | Status: DC | PRN
  Administered 2023-09-10: 5 mg via INTRAVENOUS

## 2023-09-10 MED ORDER — WATER FOR IRRIGATION, STERILE IR SOLN
Status: DC | PRN
  Administered 2023-09-10: 3000 mL via INTRAVESICAL
  Administered 2023-09-10: 500 mL

## 2023-09-10 MED ORDER — GLYCOPYRROLATE PF 0.2 MG/ML IJ SOSY
PREFILLED_SYRINGE | INTRAMUSCULAR | Status: DC | PRN
  Administered 2023-09-10 (×2): .1 mg via INTRAVENOUS

## 2023-09-10 MED ORDER — LIDOCAINE HCL (PF) 2 % IJ SOLN
INTRAMUSCULAR | Status: AC
  Filled 2023-09-10: qty 5

## 2023-09-10 MED ORDER — PROPOFOL 10 MG/ML IV BOLUS
INTRAVENOUS | Status: AC
  Filled 2023-09-10: qty 20

## 2023-09-10 MED ORDER — PROPOFOL 10 MG/ML IV BOLUS
INTRAVENOUS | Status: DC | PRN
  Administered 2023-09-10: 150 mg via INTRAVENOUS

## 2023-09-10 MED ORDER — TRAMADOL HCL 50 MG PO TABS: 50.0000 mg | ORAL_TABLET | Freq: Four times a day (QID) | ORAL | 0 refills | Status: AC | PRN

## 2023-09-10 MED ORDER — SEVOFLURANE IN SOLN
RESPIRATORY_TRACT | Status: AC
  Filled 2023-09-10: qty 250

## 2023-09-10 MED ORDER — MIDAZOLAM HCL 5 MG/5ML IJ SOLN
INTRAMUSCULAR | Status: DC | PRN
  Administered 2023-09-10 (×2): 1 mg via INTRAVENOUS

## 2023-09-10 MED ORDER — PHENYLEPHRINE 80 MCG/ML (10ML) SYRINGE FOR IV PUSH (FOR BLOOD PRESSURE SUPPORT)
PREFILLED_SYRINGE | INTRAVENOUS | Status: AC
  Filled 2023-09-10: qty 10

## 2023-09-10 MED ORDER — LACTATED RINGERS IV SOLN
INTRAVENOUS | Status: DC | PRN
Start: 2023-09-10 — End: 2023-09-10

## 2023-09-10 MED ORDER — MIDAZOLAM HCL 2 MG/2ML IJ SOLN
INTRAMUSCULAR | Status: AC
  Filled 2023-09-10: qty 2

## 2023-09-10 MED ORDER — FENTANYL CITRATE (PF) 100 MCG/2ML IJ SOLN
INTRAMUSCULAR | Status: DC | PRN
  Administered 2023-09-10 (×2): 25 ug via INTRAVENOUS

## 2023-09-10 MED ORDER — ONDANSETRON HCL 4 MG/2ML IJ SOLN
INTRAMUSCULAR | Status: DC | PRN
  Administered 2023-09-10: 4 mg via INTRAVENOUS

## 2023-09-10 SURGICAL SUPPLY — 20 items
BAG DRAIN URO TABLE W/ADPT NS (BAG) ×2 IMPLANT
BAG DRN 8 ADPR NS SKTRN CSTL (BAG) ×1
BAG HAMPER (MISCELLANEOUS) ×2 IMPLANT
CLOTH BEACON ORANGE TIMEOUT ST (SAFETY) ×2 IMPLANT
GLOVE BIO SURGEON STRL SZ8 (GLOVE) ×2 IMPLANT
GLOVE BIOGEL PI IND STRL 7.0 (GLOVE) ×4 IMPLANT
GOWN STRL REUS W/TWL LRG LVL3 (GOWN DISPOSABLE) ×2 IMPLANT
GOWN STRL REUS W/TWL XL LVL3 (GOWN DISPOSABLE) ×2 IMPLANT
KIT TURNOVER CYSTO (KITS) ×2 IMPLANT
MANIFOLD NEPTUNE II (INSTRUMENTS) ×2 IMPLANT
PACK CYSTO (CUSTOM PROCEDURE TRAY) ×2 IMPLANT
PAD ARMBOARD 7.5X6 YLW CONV (MISCELLANEOUS) ×2 IMPLANT
POSITIONER HEAD 8X9X4 ADT (SOFTGOODS) ×2 IMPLANT
SYSTEM UROLIFT (Male Continence) IMPLANT
SYSTEM UROLIFT 2 CARTRIDGE (Male Continence) IMPLANT
TOWEL OR 17X26 4PK STRL BLUE (TOWEL DISPOSABLE) ×2 IMPLANT
TRAY FOLEY W/BAG SLVR 16FR (SET/KITS/TRAYS/PACK) ×1
TRAY FOLEY W/BAG SLVR 16FR ST (SET/KITS/TRAYS/PACK) ×2 IMPLANT
WATER STERILE IRR 3000ML UROMA (IV SOLUTION) ×2 IMPLANT
WATER STERILE IRR 500ML POUR (IV SOLUTION) ×2 IMPLANT

## 2023-09-10 NOTE — Anesthesia Preprocedure Evaluation (Addendum)
Anesthesia Evaluation  Patient identified by MRN, date of birth, ID band Patient awake    Reviewed: Allergy & Precautions, H&P , NPO status , Patient's Chart, lab work & pertinent test results, reviewed documented beta blocker date and time   Airway Mallampati: II  TM Distance: >3 FB Neck ROM: full    Dental no notable dental hx.    Pulmonary neg pulmonary ROS   Pulmonary exam normal breath sounds clear to auscultation       Cardiovascular Exercise Tolerance: Good hypertension,  Rhythm:regular Rate:Normal     Neuro/Psych negative neurological ROS  negative psych ROS   GI/Hepatic negative GI ROS, Neg liver ROS,,,  Endo/Other  negative endocrine ROS    Renal/GU negative Renal ROS  negative genitourinary   Musculoskeletal   Abdominal   Peds  Hematology negative hematology ROS (+)   Anesthesia Other Findings   Reproductive/Obstetrics negative OB ROS                              Anesthesia Physical Anesthesia Plan  ASA: 2  Anesthesia Plan: General and General LMA   Post-op Pain Management:    Induction:   PONV Risk Score and Plan: Ondansetron  Airway Management Planned:   Additional Equipment:   Intra-op Plan:   Post-operative Plan:   Informed Consent: I have reviewed the patients History and Physical, chart, labs and discussed the procedure including the risks, benefits and alternatives for the proposed anesthesia with the patient or authorized representative who has indicated his/her understanding and acceptance.     Dental Advisory Given  Plan Discussed with: CRNA  Anesthesia Plan Comments:         Anesthesia Quick Evaluation

## 2023-09-10 NOTE — Op Note (Signed)
   PREOPERATIVE DIAGNOSIS:  Benign prostatic hypertrophy with bladder outlet obstruction.  POSTOPERATIVE DIAGNOSIS:  Benign prostatic hypertrophy with bladder outlet obstruction.  PROCEDURE:  Cystoscopy with implantation of UroLift devices, 6 implants.  SURGEON:  Wilkie Aye, M.D.  ANESTHESIA:  General  ANTIBIOTICS: ancef  SPECIMEN:  None.  DRAINS:  A 16-French Foley catheter.  BLOOD LOSS:  Minimal.  COMPLICATIONS:  None.  INDICATIONS: The Patient is an 72 year old male with BPH and bladder outlet obstruction.  He has failed medical therapy and has elected UroLift for definitive treatment.  FINDINGS OF PROCEDURE:  He was taken to the operating room where a genral anesthetic was induced.  He was placed in lithotomy position and was fitted with PAS hose.  His perineum and genitalia were prepped with chlorhexidine, and he was draped in usual sterile fashion.  Cystoscopy was performed using the UroLift scope and 0 degree lens. Examination revealed a normal urethra.  The external sphincter was intact.  Prostatic urethra was approximately 5 cm in length with lateral lobe enlargement. There was also little bit of bladder neck elevation. Inspection of bladder revealed mild-to-moderate trabeculation with no tumors, stones, or inflammation.  No cellules or diverticula were noted. Ureteral orifices were in their normal anatomic position effluxing clear urine.  After initial cystoscopy, the visual obturator was replaced with the first UroLift device.  This was turned to the 9 o'clock position and pulled back to the veru and then slightly advanced.  Pressure was then applied to the right lateral lobe and the UroLift device was deployed.  The second UroLift device was then inserted and applied to the left lateral lobe at 3 o'clock and deployed in the mid prostatic urethra. After this, there was still some apparent obstruction closer to the bladder neck.  So a second and  third level of UroLift your left device was applied between the mid urethra and the proximal urethra providing further patency to the prostatic urethra.   The scope was removed and a 16-French Foley catheter was inserted without difficulty. The balloon was filled with 10 mL sterile fluid, and the catheter was placed to straight drainage.  COMPLICATIONS: None   CONDITION: Stable, extubated, transferred to PACU  PLAN: The patient will be discharged home and followup in 2 days for a voiding trial.

## 2023-09-10 NOTE — Addendum Note (Signed)
Addendum  created 09/10/23 0856 by Julian Reil, CRNA   Flowsheet accepted, Intraprocedure Flowsheets edited

## 2023-09-10 NOTE — H&P (Signed)
PI: Jeremy Casey is a 71yo here for Urolift. IPSS 16 QOL 3 on no BPH therapy. He tried uroxatral which caused hypotension and he had to stop the medication. When he took the medication his LUTS resolved. Nocturia 0-1x. Urine stream was strong. Now his LUTS have worsened.      PMH:     Past Medical History:  Diagnosis Date   Crohn's disease (HCC)     Elevated PSA     Essential hypertension, benign      [facility]          Surgical History:      Past Surgical History:  Procedure Laterality Date   HIP SURGERY       KNEE SURGERY       MOUTH SURGERY   2014    repeat from root canal, 2 teeth pulled   PE tube placement   2002    secondary to serous otitis   ROOT CANAL   2013    failed          Home Medications:  Allergies as of 07/02/2023         Reactions    Tamsulosin Hcl Other (See Comments)    Drop in blood pressure     Amoxicillin Other (See Comments)            Medication List           Accurate as of July 02, 2023 11:26 AM. If you have any questions, ask your nurse or doctor.              alfuzosin 10 MG 24 hr tablet Commonly known as: UROXATRAL Take 1 tablet (10 mg total) by mouth at bedtime.    aspirin 81 MG chewable tablet 1 tablet    Breo Ellipta 200-25 MCG/INH Aepb Generic drug: fluticasone furoate-vilanterol    ciprofloxacin-dexamethasone OTIC suspension Commonly known as: Ciprodex Place 4 drops into the right ear 2 (two) times daily.    clindamycin 1 % gel Commonly known as: CLINDAGEL    doxycycline 100 MG capsule Commonly known as: VIBRAMYCIN Take 1 capsule (100 mg total) by mouth every 12 (twelve) hours.    fluticasone 0.05 % cream Commonly known as: CUTIVATE fluticasone propionate 0.05 % topical cream  APPLY TOPICALLY TO FACE TWICE A DAY    fluticasone 50 MCG/ACT nasal spray Commonly known as: FLONASE Place into both nostrils daily.    ipratropium 0.03 % nasal spray Commonly known as: ATROVENT Place 2 sprays into both nostrils 3  (three) times daily.    olmesartan-hydrochlorothiazide 20-12.5 MG tablet Commonly known as: BENICAR HCT Take 0.5 tablets by mouth daily.             Allergies:  Allergies       Allergies  Allergen Reactions   Tamsulosin Hcl Other (See Comments)      Drop in blood pressure    Amoxicillin Other (See Comments)        Family History:      Family History  Problem Relation Age of Onset   Hodgkin's lymphoma Father     Cancer Father     ALS Mother     Hypertension Brother     Heart attack Unknown            Social History:  reports that he has never smoked. He has never used smokeless tobacco. He reports current alcohol use. He reports that he does not use drugs.   ROS: All other review of systems  were reviewed and are negative except what is noted above in HPI   Physical Exam: BP (!) 158/78   Pulse 80   Ht 5\' 7"  (1.702 m)   Wt 198 lb (89.8 kg)   BMI 31.01 kg/m   Constitutional:  Alert and oriented, No acute distress. HEENT: [place] AT, moist mucus membranes.  Trachea midline, no masses. Cardiovascular: No clubbing, cyanosis, or edema. Respiratory: Normal respiratory effort, no increased work of breathing. GI: Abdomen is soft, nontender, nondistended, no abdominal masses GU: No CVA tenderness.  Lymph: No cervical or inguinal lymphadenopathy. Skin: No rashes, bruises or suspicious lesions. Neurologic: Grossly intact, no focal deficits, moving all 4 extremities. Psychiatric: Normal mood and affect.   Laboratory Data: Recent Labs       Lab Results  Component Value Date    WBC 7.3 01/26/2022    HGB 15.1 01/26/2022    HCT 45.2 01/26/2022    MCV 91 01/26/2022    PLT 195 10/16/2016        Recent Labs       Lab Results  Component Value Date    CREATININE 1.14 10/16/2016        Recent Labs  No results found for: "PSA"     Recent Labs  No results found for: "TESTOSTERONE"     Recent Labs  No results found for: "HGBA1C"     Urinalysis Labs (Brief)           Component Value Date/Time    COLORURINE YELLOW 10/16/2016 2320    APPEARANCEUR Clear 03/30/2023 0839    LABSPEC 1.010 10/16/2016 2320    PHURINE 5.5 10/16/2016 2320    GLUCOSEU Negative 03/30/2023 0839    HGBUR TRACE (A) 10/16/2016 2320    BILIRUBINUR Negative 03/30/2023 0839    KETONESUR 40 (A) 10/16/2016 2320    PROTEINUR Negative 03/30/2023 0839    PROTEINUR NEGATIVE 10/16/2016 2320    NITRITE Negative 03/30/2023 0839    NITRITE NEGATIVE 10/16/2016 2320    LEUKOCYTESUR Negative 03/30/2023 0839        Recent Labs       Lab Results  Component Value Date    LABMICR See below: 03/30/2023    WBCUA 0-5 03/30/2023    LABEPIT None seen 03/30/2023    MUCUS Present 03/30/2023    BACTERIA None seen 03/30/2023        Pertinent Imaging:   No results found for this or any previous visit.   No results found for this or any previous visit.   No results found for this or any previous visit.   No results found for this or any previous visit.   No results found for this or any previous visit.   No valid procedures specified. No results found for this or any previous visit.   No results found for this or any previous visit.     Assessment & Plan:     1. Elevated prostate specific antigen (PSA) -followup 6 months with a PSA.  - Urinalysis, Routine w reflex microscopic   2. Nocturia We discussed the management of his BPH including continued medical therapy, Rezum, Urolift, TURP and simple prostatectomy. After discussing the options the patient has elected to proceed with Urolift. Risks/benefits/alternatives discussed.         Wilkie Aye, MD

## 2023-09-10 NOTE — Anesthesia Procedure Notes (Signed)
Procedure Name: LMA Insertion Date/Time: 09/10/2023 7:46 AM  Performed by: Julian Reil, CRNAPre-anesthesia Checklist: Patient identified, Emergency Drugs available, Suction available and Patient being monitored Patient Re-evaluated:Patient Re-evaluated prior to induction Oxygen Delivery Method: Circle system utilized Preoxygenation: Pre-oxygenation with 100% oxygen Induction Type: IV induction Ventilation: Mask ventilation without difficulty LMA: LMA inserted LMA Size: 4.0 Tube type: Oral Placement Confirmation: positive ETCO2 Tube secured with: Tape Dental Injury: Teeth and Oropharynx as per pre-operative assessment

## 2023-09-10 NOTE — Transfer of Care (Signed)
Immediate Anesthesia Transfer of Care Note  Patient: Jeremy Casey  Procedure(s) Performed: CYSTOSCOPY WITH INSERTION OF UROLIFT  Patient Location: PACU  Anesthesia Type:General  Level of Consciousness: drowsy  Airway & Oxygen Therapy: Patient Spontanous Breathing and Patient connected to face mask oxygen  Post-op Assessment: Report given to RN and Post -op Vital signs reviewed and stable  Post vital signs: Reviewed and stable  Last Vitals:  Vitals Value Taken Time  BP    Temp    Pulse 71 09/10/23 0816  Resp 13 09/10/23 0816  SpO2 100 % 09/10/23 0816  Vitals shown include unfiled device data.  Last Pain:  Vitals:   09/10/23 0648  TempSrc: Oral  PainSc: 0-No pain      Patients Stated Pain Goal: 8 (09/10/23 1610)  Complications: No notable events documented.

## 2023-09-10 NOTE — Anesthesia Postprocedure Evaluation (Signed)
Anesthesia Post Note  Patient: Jeremy Casey  Procedure(s) Performed: CYSTOSCOPY WITH INSERTION OF UROLIFT  Patient location during evaluation: Phase II Anesthesia Type: General Level of consciousness: awake Pain management: pain level controlled Vital Signs Assessment: post-procedure vital signs reviewed and stable Respiratory status: spontaneous breathing and respiratory function stable Cardiovascular status: blood pressure returned to baseline and stable Postop Assessment: no headache and no apparent nausea or vomiting Anesthetic complications: no Comments: Late entry   No notable events documented.   Last Vitals:  Vitals:   09/10/23 0648 09/10/23 0815  BP: (!) 180/83 106/67  Pulse:  71  Resp:  13  Temp: 36.8 C (!) 36.3 C  SpO2: 99% 100%    Last Pain:  Vitals:   09/10/23 0815  TempSrc:   PainSc: Asleep                 Windell Norfolk

## 2023-09-11 ENCOUNTER — Telehealth: Payer: Self-pay

## 2023-09-11 DIAGNOSIS — T83098A Other mechanical complication of other indwelling urethral catheter, initial encounter: Secondary | ICD-10-CM | POA: Diagnosis not present

## 2023-09-11 DIAGNOSIS — N281 Cyst of kidney, acquired: Secondary | ICD-10-CM | POA: Diagnosis not present

## 2023-09-11 DIAGNOSIS — Z9049 Acquired absence of other specified parts of digestive tract: Secondary | ICD-10-CM | POA: Diagnosis not present

## 2023-09-11 DIAGNOSIS — Y738 Miscellaneous gastroenterology and urology devices associated with adverse incidents, not elsewhere classified: Secondary | ICD-10-CM | POA: Diagnosis not present

## 2023-09-11 DIAGNOSIS — R319 Hematuria, unspecified: Secondary | ICD-10-CM | POA: Diagnosis not present

## 2023-09-11 DIAGNOSIS — N3289 Other specified disorders of bladder: Secondary | ICD-10-CM | POA: Diagnosis not present

## 2023-09-11 DIAGNOSIS — I700 Atherosclerosis of aorta: Secondary | ICD-10-CM | POA: Diagnosis not present

## 2023-09-11 DIAGNOSIS — T83038A Leakage of other indwelling urethral catheter, initial encounter: Secondary | ICD-10-CM | POA: Diagnosis not present

## 2023-09-11 DIAGNOSIS — K5730 Diverticulosis of large intestine without perforation or abscess without bleeding: Secondary | ICD-10-CM | POA: Diagnosis not present

## 2023-09-11 NOTE — Progress Notes (Signed)
Post op call done, patient concerned with incontinence around catheter, states catheter is draining well, does state it feels like having bladder spasms, has left message with urology office. Has appointment to in office tomorrow.

## 2023-09-11 NOTE — Telephone Encounter (Signed)
Patient states that he has noticed a lot of blood draining from and around his catheter.  I assured him that was normal after his surgery, he is scheduled for VT with Sarah tomorrow.  Patient informed to keep his appt and Maralyn Sago will evaluate at that time.  Patient denies any fever at this time.

## 2023-09-12 ENCOUNTER — Ambulatory Visit: Payer: Medicare HMO | Admitting: Urology

## 2023-09-12 ENCOUNTER — Encounter: Payer: Self-pay | Admitting: Urology

## 2023-09-12 VITALS — BP 161/88 | HR 85 | Temp 98.5°F | Ht 67.0 in | Wt 197.9 lb

## 2023-09-12 DIAGNOSIS — Z9049 Acquired absence of other specified parts of digestive tract: Secondary | ICD-10-CM | POA: Diagnosis not present

## 2023-09-12 DIAGNOSIS — N281 Cyst of kidney, acquired: Secondary | ICD-10-CM | POA: Diagnosis not present

## 2023-09-12 DIAGNOSIS — D72829 Elevated white blood cell count, unspecified: Secondary | ICD-10-CM | POA: Diagnosis not present

## 2023-09-12 DIAGNOSIS — R972 Elevated prostate specific antigen [PSA]: Secondary | ICD-10-CM

## 2023-09-12 DIAGNOSIS — N401 Enlarged prostate with lower urinary tract symptoms: Secondary | ICD-10-CM

## 2023-09-12 DIAGNOSIS — Z09 Encounter for follow-up examination after completed treatment for conditions other than malignant neoplasm: Secondary | ICD-10-CM

## 2023-09-12 DIAGNOSIS — Z87438 Personal history of other diseases of male genital organs: Secondary | ICD-10-CM | POA: Diagnosis not present

## 2023-09-12 DIAGNOSIS — I700 Atherosclerosis of aorta: Secondary | ICD-10-CM | POA: Diagnosis not present

## 2023-09-12 DIAGNOSIS — R319 Hematuria, unspecified: Secondary | ICD-10-CM | POA: Diagnosis not present

## 2023-09-12 DIAGNOSIS — K5730 Diverticulosis of large intestine without perforation or abscess without bleeding: Secondary | ICD-10-CM | POA: Diagnosis not present

## 2023-09-12 DIAGNOSIS — N3289 Other specified disorders of bladder: Secondary | ICD-10-CM | POA: Diagnosis not present

## 2023-09-12 MED ORDER — CIPROFLOXACIN HCL 500 MG PO TABS
500.0000 mg | ORAL_TABLET | Freq: Once | ORAL | Status: DC
Start: 2023-09-12 — End: 2023-09-12

## 2023-09-12 MED ORDER — SULFAMETHOXAZOLE-TRIMETHOPRIM 800-160 MG PO TABS: 1.0000 | ORAL_TABLET | Freq: Two times a day (BID) | ORAL | 0 refills | Status: AC

## 2023-09-12 NOTE — Progress Notes (Addendum)
Fill and Pull Catheter Removal  Patient is present today for a catheter removal.  Patient was cleaned and prepped in a sterile fashion of sterile water/ saline was instilled into the bladder.  Patient had several bladder spasm during procedure so we were unable to complete voiding trial. 10ml of water was then drained from the balloon.  A 18FR coude foley cath was removed from the bladder no complications were noted . Patient tolerated well.  Performed by: Guss Bunde, CMA  Follow up/ Additional notes: Patient will return this afternoon for PVR

## 2023-09-12 NOTE — Progress Notes (Signed)
post void residual =213ml  Patient unable to void on his own after voiding trial.  Dr. Retta Diones gave verbal orders to place catheter.    Simple Catheter Placement  Due to urinary retention patient is present today for a foley cath placement.  Patient was cleaned and prepped in a sterile fashion with betadine and 2% lidocaine jelly was instilled into the urethra. A 16 FR foley catheter was inserted, urine return was noted  , urine was yellow in color.  The balloon was filled with 10cc of sterile water.  A leg bag was attached for drainage. Patient was also given a night bag to take home and was given instruction on how to change from one bag to another.  Patient was given instruction on proper catheter care.  Patient tolerated well, no complications were noted   Performed by: Guss Bunde, CMA  Additional notes/ Follow up: Return in about 4 weeks (around 10/10/2023) for f/u with Dr. Ronne Binning.

## 2023-09-12 NOTE — Addendum Note (Signed)
Addended by: Grier Rocher on: 09/12/2023 09:38 AM   Modules accepted: Orders

## 2023-09-13 ENCOUNTER — Telehealth: Payer: Self-pay

## 2023-09-13 NOTE — Telephone Encounter (Signed)
-----   Message from Donnita Falls sent at 09/13/2023  9:07 AM EDT ----- OK thank you for letting me know. I am hesitant to prescribe anything for bladder spasms (such as Oxybutynin 5 mg) because those medications can all increase the risk for incomplete bladder emptying. I prescribed Bactrim DS 2x/day for 5 days for possible CAUTI. Please recommend he take that and also address his constipation with adequate fluid intake daily along with laxatives / stool softeners PRN. ----- Message ----- From: Grier Rocher, CMA Sent: 09/12/2023   4:30 PM EDT To: Donnita Falls, FNP  Patient came back after you left for the day.  He was unable to void after VT this am.  Dr. Retta Diones requested the catheter be replaced.  73 F cath placed, pt scheduled for a VT with you in 1 week.  Patient is complaining of bladder spasms, not sure if you recommended and rx for that.  He also states he has been constipated for about a week.

## 2023-09-13 NOTE — Progress Notes (Signed)
Name: Jeremy Casey DOB: [birth year] MRN: [id number]  History of Present Illness: Jeremy Casey is a 72 y.o. male who presents today for follow up visit at [facility] [place]. He is accompanied by his wife. - GU history: 1. BPH with BOO & LUTS (nocturia).  - Unable to tolerate alpha-blockers; Flomax and Uroxatral caused hypotension. 2. Elevated PSA.  Recent history:  > 09/10/2023: Underwent cystoscopy with implantation of UroLift devices by Dr. Ronne Binning.  > 09/13/2023: Postop visit. Failed voiding trial. Foley catheter replaced. Prescribed Bactrim DS 2x/day for 5 days for possible CAUTI.  Today: He reports rectal pain which started about 5 days ago after he had been constipated for several days and finally was able to defecate. He denies constipation at this time; not needing stool softener or laxative anymore. The rectal pain has persisted and is constant; worse with direct pressure / sitting.  He denies gross hematuria.  Reports strong urge to void over the past few days; denies urine leakage around catheter. He denies flank pain or abdominal pain. He denies fevers, nausea, or vomiting.   Fall Screening: Do you usually have a device to assist in your mobility? No   Medications: Current Outpatient Medications  Medication Sig Dispense Refill   aspirin 81 MG chewable tablet 1 tablet     clindamycin (CLINDAGEL) 1 % gel Apply 1 Application topically daily as needed (pre skin cancer).     doxycycline (VIBRAMYCIN) 100 MG capsule Take 1 capsule (100 mg total) by mouth every 12 (twelve) hours for 28 days. 56 capsule 0   fluticasone (CUTIVATE) 0.05 % cream Apply 1 Application topically daily as needed (pre cancer on face).     fluticasone (FLONASE) 50 MCG/ACT nasal spray Place 2 sprays into both nostrils daily.     loratadine (CLARITIN) 10 MG tablet Take 10 mg by mouth daily.     metroNIDAZOLE (METROCREAM) 0.75 % cream Apply 1 Application topically daily as needed (pre cancer).      olmesartan-hydrochlorothiazide (BENICAR HCT) 20-12.5 MG per tablet Take 0.5 tablets by mouth daily.      traMADol (ULTRAM) 50 MG tablet Take 1 tablet (50 mg total) by mouth every 6 (six) hours as needed. 15 tablet 0   No current facility-administered medications for this visit.    Allergies: Allergies  Allergen Reactions   Tamsulosin Hcl Other (See Comments)    Drop in blood pressure    Amoxicillin Other (See Comments)    Gi- Upset    Past Medical History:  Diagnosis Date   Crohn's disease (HCC)    Elevated PSA    Essential hypertension, benign    [facility]   Seasonal allergies    Past Surgical History:  Procedure Laterality Date   CYSTOSCOPY WITH INSERTION OF UROLIFT N/A 09/10/2023   Procedure: CYSTOSCOPY WITH INSERTION OF UROLIFT;  Surgeon: Malen Gauze, MD;  Location: [facility];  Service: Urology;  Laterality: N/A;   HIP SURGERY     KNEE SURGERY     MOUTH SURGERY  2014   repeat from root canal, 2 teeth pulled   PE tube placement  2002   secondary to serous otitis   ROOT CANAL  2013   failed   Family History  Problem Relation Age of Onset   Hodgkin's lymphoma Father    Cancer Father    ALS Mother    Hypertension Brother    Heart attack Unknown    Social History   Socioeconomic History   Marital status: Married  Spouse name: Not on file   Number of children: 2   Years of education: Not on file   Highest education level: Not on file  Occupational History   Not on file  Tobacco Use   Smoking status: Never   Smokeless tobacco: Never  Substance and Sexual Activity   Alcohol use: Not Currently   Drug use: No   Sexual activity: Not on file  Other Topics Concern   Not on file  Social History Narrative   Not on file   Social Determinants of Health   Financial Resource Strain: Not on file  Food Insecurity: Not on file  Transportation Needs: Not on file  Physical Activity: Not on file  Stress: Not on file  Social Connections: Unknown (09/11/2023)    Received from [facility]   Social Network    Social Network: Not on file  Intimate Partner Violence: Not At Risk (09/12/2023)   Received from [facility]   HITS    Over the last 12 months how often did your partner physically hurt you?: 1    Over the last 12 months how often did your partner insult you or talk down to you?: 1    Over the last 12 months how often did your partner threaten you with physical harm?: 1    Over the last 12 months how often did your partner scream or curse at you?: 1    Review of Systems Constitutional: Patient denies any unintentional weight loss or change in strength lntegumentary: Patient denies any rashes or pruritus Cardiovascular: Patient denies chest pain or syncope Respiratory: Patient denies shortness of breath Gastrointestinal: Patient denies nausea, vomiting, constipation, or diarrhea Musculoskeletal: Patient denies muscle cramps or weakness Neurologic: Patient denies convulsions or seizures Allergic/Immunologic: Patient denies recent allergic reaction(s) Hematologic/Lymphatic: Patient denies bleeding tendencies Endocrine: Patient denies heat/cold intolerance  GU: As per HPI.  OBJECTIVE Vitals:   09/19/23 1207  BP: (!) 173/79  Pulse: 97   There is no height or weight on file to calculate BMI.  Physical Examination Constitutional: No obvious distress; patient is non-toxic appearing  Cardiovascular: No visible lower extremity edema.  Respiratory: The patient does not have audible wheezing/stridor; respirations do not appear labored  Gastrointestinal: Abdomen non-distended Musculoskeletal: Normal ROM of UEs  Skin: No obvious rashes/open sores  Neurologic: CN 2-12 grossly intact Psychiatric: Answered questions appropriately with normal affect  Hematologic/Lymphatic/Immunologic: No obvious bruises or sites of spontaneous bleeding  ASSESSMENT Benign prostatic hyperplasia with urinary obstruction - Plan: ciprofloxacin (CIPRO) tablet 500  mg, Bladder Voiding Trial, doxycycline (VIBRAMYCIN) 100 MG capsule  Postoperative retention of urine - Plan: ciprofloxacin (CIPRO) tablet 500 mg, Bladder Voiding Trial, doxycycline (VIBRAMYCIN) 100 MG capsule  Postop check - Plan: ciprofloxacin (CIPRO) tablet 500 mg, Bladder Voiding Trial, doxycycline (VIBRAMYCIN) 100 MG capsule  Acute bacterial prostatitis - Plan: doxycycline (VIBRAMYCIN) 100 MG capsule  Rectal pain  Failed repeat voiding trial today. Unable to tolerate alpha blockers due to side effect of hypotension. We discussed suspected acute bacterial prostatitis based on findings. Consulted with Dr. Ronne Binning, who agreed with recommendation for Foley catheter replacement and Doxycycline x28 days followed by repeat voiding trial after completion of antibiotic course. Patient hesitant to have Foley reinserted at this time; we agreed to have him return in a few hours today for PVR check. He was advised to drink plenty of fluids in the meantime and that if unable to adequately empty his bladder when he returns then Foley catheter will need to be  reinserted. Pt verbalized understanding and agreement. All questions were answered.  PLAN Advised the following: 1. Foley discontinued; pending PVR check this afternoon. 2. Doxycycline 100 mg 2x/day x28 days. 3. Return in about 4 weeks (around 10/17/2023) for f/u with Evette Georges, NP (morning appointment for possible voiding trial).  Orders Placed This Encounter  Procedures   Bladder Voiding Trial    It has been explained that the patient is to follow regularly with their PCP in addition to all other providers involved in their care and to follow instructions provided by these respective offices. Patient advised to contact urology clinic if any urologic-pertaining questions, concerns, new symptoms or problems arise in the interim period.  There are no Patient Instructions on file for this visit.  Electronically signed by:  Donnita Falls, FNP    09/19/23    12:54 PM

## 2023-09-13 NOTE — Telephone Encounter (Signed)
I called to follow up with patient after having catheter placed.  His catheter is draining fine, bladder spasms have eased up.  He is aware of Sarah's response and will follow up as scheduled.

## 2023-09-19 ENCOUNTER — Ambulatory Visit: Payer: Medicare HMO | Admitting: Urology

## 2023-09-19 ENCOUNTER — Encounter: Payer: Self-pay | Admitting: Urology

## 2023-09-19 VITALS — BP 173/79 | HR 97

## 2023-09-19 DIAGNOSIS — N410 Acute prostatitis: Secondary | ICD-10-CM

## 2023-09-19 DIAGNOSIS — R338 Other retention of urine: Secondary | ICD-10-CM

## 2023-09-19 DIAGNOSIS — Z09 Encounter for follow-up examination after completed treatment for conditions other than malignant neoplasm: Secondary | ICD-10-CM

## 2023-09-19 DIAGNOSIS — K6289 Other specified diseases of anus and rectum: Secondary | ICD-10-CM

## 2023-09-19 DIAGNOSIS — N138 Other obstructive and reflux uropathy: Secondary | ICD-10-CM

## 2023-09-19 DIAGNOSIS — N9989 Other postprocedural complications and disorders of genitourinary system: Secondary | ICD-10-CM

## 2023-09-19 DIAGNOSIS — N401 Enlarged prostate with lower urinary tract symptoms: Secondary | ICD-10-CM

## 2023-09-19 LAB — BLADDER SCAN AMB NON-IMAGING: Scan Result: 170

## 2023-09-19 MED ORDER — CIPROFLOXACIN HCL 500 MG PO TABS
500.0000 mg | ORAL_TABLET | Freq: Once | ORAL | Status: AC
Start: 2023-09-19 — End: 2023-09-19
  Administered 2023-09-19: 500 mg via ORAL

## 2023-09-19 MED ORDER — DOXYCYCLINE HYCLATE 100 MG PO CAPS
100.0000 mg | ORAL_CAPSULE | Freq: Two times a day (BID) | ORAL | 0 refills | Status: DC
Start: 2023-09-19 — End: 2023-09-27

## 2023-09-19 NOTE — Addendum Note (Signed)
Addended by: Christoper Fabian R on: 09/19/2023 03:26 PM   Modules accepted: Orders

## 2023-09-19 NOTE — Progress Notes (Signed)
Fill and Pull Catheter Removal  Patient is present today for a catheter removal.  Patient was cleaned and prepped in a sterile fashion of sterile water/ saline was instilled into the bladder when the patient felt the urge to urinate. 10ml of water was then drained from the balloon.  A 16FR foley cath was removed from the bladder no complications were noted .  Patient as then given some time to void on their own.  Patient cannot void  0ml on their own after some time.  Patient tolerated well.   Performed by: Kennyth Lose, CMA PVR @ 3 pm Additional notes/ Follow up: No follow-ups on file.

## 2023-09-27 ENCOUNTER — Other Ambulatory Visit: Payer: Self-pay | Admitting: Urology

## 2023-09-27 ENCOUNTER — Telehealth: Payer: Self-pay

## 2023-09-27 DIAGNOSIS — N410 Acute prostatitis: Secondary | ICD-10-CM

## 2023-09-27 MED ORDER — SULFAMETHOXAZOLE-TRIMETHOPRIM 800-160 MG PO TABS
1.0000 | ORAL_TABLET | Freq: Two times a day (BID) | ORAL | 0 refills | Status: AC
Start: 2023-09-27 — End: 2023-10-25

## 2023-09-27 NOTE — Telephone Encounter (Signed)
Patient seen last week and given doxycyline for prostate infection. Patient also had a voiding trial.  Patient 8 days in to 28 medication therapy of doxycyline. Patient reports prostate pain/urine pain unchanged and feels it is slightly worse. Complains of aching in prostate and dysuria.  Messaged sent to NP to advise.

## 2023-09-28 NOTE — Telephone Encounter (Signed)
Patient called and made aware.

## 2023-10-18 ENCOUNTER — Encounter: Payer: Self-pay | Admitting: Urology

## 2023-10-18 ENCOUNTER — Ambulatory Visit: Payer: Medicare HMO | Admitting: Urology

## 2023-10-18 VITALS — BP 119/75 | HR 84

## 2023-10-18 DIAGNOSIS — R351 Nocturia: Secondary | ICD-10-CM | POA: Diagnosis not present

## 2023-10-18 DIAGNOSIS — N401 Enlarged prostate with lower urinary tract symptoms: Secondary | ICD-10-CM

## 2023-10-18 DIAGNOSIS — R972 Elevated prostate specific antigen [PSA]: Secondary | ICD-10-CM | POA: Diagnosis not present

## 2023-10-18 DIAGNOSIS — R339 Retention of urine, unspecified: Secondary | ICD-10-CM | POA: Diagnosis not present

## 2023-10-18 LAB — URINALYSIS, ROUTINE W REFLEX MICROSCOPIC
Bilirubin, UA: NEGATIVE
Glucose, UA: NEGATIVE
Ketones, UA: NEGATIVE
Nitrite, UA: NEGATIVE
Protein,UA: NEGATIVE
RBC, UA: NEGATIVE
Specific Gravity, UA: 1.01 (ref 1.005–1.030)
Urobilinogen, Ur: 0.2 mg/dL (ref 0.2–1.0)
pH, UA: 6 (ref 5.0–7.5)

## 2023-10-18 LAB — MICROSCOPIC EXAMINATION
Bacteria, UA: NONE SEEN
RBC, Urine: NONE SEEN /[HPF] (ref 0–2)

## 2023-10-18 LAB — BLADDER SCAN AMB NON-IMAGING: Scan Result: 99

## 2023-10-18 NOTE — Progress Notes (Signed)
Name: Jeremy Casey DOB: 02/27/1951 MRN: [id number]  History of Present Illness: Mr. Jeremy Casey is a 72 y.o. male who presents today for follow up visit at [facility] [place]. - GU history: 1. BPH with BOO & LUTS (nocturia).  - Unable to tolerate alpha blockers due to side effect of hypotension (previously tried Flomax and Uroxatral). 2. Elevated PSA.   PSA values: - 04/22/2021: 3.4 - 10/28/2021: 3.9 - 11/01/2022: 6.7 - 11/08/2022: 7.7 - 12/21/2022: 8.7 - 03/21/2023: 5.4 - 06/25/2023: 5.6  Recent history:  > 09/10/2023: Underwent cystoscopy with implantation of UroLift devices by Dr. Ronne Binning.   > 09/13/2023: Postop visit. Failed voiding trial. Foley catheter replaced. Prescribed Bactrim DS 2x/day for 5 days for possible CAUTI.  At last visit on 09/19/2023: - Failed repeat voiding trial.  - Suspected acute bacterial prostatitis based on findings. Consulted with Dr. Ronne Binning, who agreed with recommendation for Foley catheter replacement and Doxycycline x28 days followed by repeat voiding trial after completion of antibiotic course.  - Patient declined to have Foley reinserted, therefore the plan was for PVR recheck that afternoon however patient did not return for that.  Since last visit: > 09/27/2023: - Patient reports no improvement / slight worsening of prostatitis symptoms despite taking Doxycycline.  - Advised patient to discontinue Doxycycline and start Bactrim DS as alternative.  Today: He reports resolution of his prostatitis symptoms aside from brief mild discomfort near the rectum just after voiding. States he has 1 week left of the Bactrim.  He reports that his urinary stream is much improved compared to preop. He denies urinary hesitancy, urgency, frequency, dysuria, gross hematuria, straining to void, or sensations of incomplete emptying. Reports nocturia x3.  Fall Screening: Do you usually have a device to assist in your mobility? No    Medications: Current Outpatient Medications  Medication Sig Dispense Refill   aspirin 81 MG chewable tablet 1 tablet     clindamycin (CLINDAGEL) 1 % gel Apply 1 Application topically daily as needed (pre skin cancer).     fluticasone (CUTIVATE) 0.05 % cream Apply 1 Application topically daily as needed (pre cancer on face).     fluticasone (FLONASE) 50 MCG/ACT nasal spray Place 2 sprays into both nostrils daily.     loratadine (CLARITIN) 10 MG tablet Take 10 mg by mouth daily.     metroNIDAZOLE (METROCREAM) 0.75 % cream Apply 1 Application topically daily as needed (pre cancer).     olmesartan-hydrochlorothiazide (BENICAR HCT) 20-12.5 MG per tablet Take 0.5 tablets by mouth daily.      sulfamethoxazole-trimethoprim (BACTRIM DS) 800-160 MG tablet Take 1 tablet by mouth every 12 (twelve) hours for 28 days. 56 tablet 0   traMADol (ULTRAM) 50 MG tablet Take 1 tablet (50 mg total) by mouth every 6 (six) hours as needed. 15 tablet 0   No current facility-administered medications for this visit.    Allergies: Allergies  Allergen Reactions   Tamsulosin Hcl Other (See Comments)    Drop in blood pressure    Amoxicillin Other (See Comments)    Gi- Upset    Past Medical History:  Diagnosis Date   Crohn's disease (HCC)    Elevated PSA    Essential hypertension, benign    [facility]   Seasonal allergies    Past Surgical History:  Procedure Laterality Date   CYSTOSCOPY WITH INSERTION OF UROLIFT N/A 09/10/2023   Procedure: CYSTOSCOPY WITH INSERTION OF UROLIFT;  Surgeon: Malen Gauze, MD;  Location: [facility];  Service: Urology;  Laterality: N/A;   HIP SURGERY     KNEE SURGERY     MOUTH SURGERY  2014   repeat from root canal, 2 teeth pulled   PE tube placement  2002   secondary to serous otitis   ROOT CANAL  2013   failed   Family History  Problem Relation Age of Onset   Hodgkin's lymphoma Father    Cancer Father    ALS Mother    Hypertension Brother    Heart attack Unknown     Social History   Socioeconomic History   Marital status: Married    Spouse name: Not on file   Number of children: 2   Years of education: Not on file   Highest education level: Not on file  Occupational History   Not on file  Tobacco Use   Smoking status: Never   Smokeless tobacco: Never  Substance and Sexual Activity   Alcohol use: Not Currently   Drug use: No   Sexual activity: Not on file  Other Topics Concern   Not on file  Social History Narrative   Not on file   Social Determinants of Health   Financial Resource Strain: Not on file  Food Insecurity: Not on file  Transportation Needs: Not on file  Physical Activity: Not on file  Stress: Not on file  Social Connections: Unknown (09/11/2023)   Received from [facility]   Social Network    Social Network: Not on file  Intimate Partner Violence: Not At Risk (09/12/2023)   Received from [facility]   HITS    Over the last 12 months how often did your partner physically hurt you?: 1    Over the last 12 months how often did your partner insult you or talk down to you?: 1    Over the last 12 months how often did your partner threaten you with physical harm?: 1    Over the last 12 months how often did your partner scream or curse at you?: 1   Review of Systems Constitutional: Patient denies any unintentional weight loss or change in strength lntegumentary: Patient denies any rashes or pruritus Cardiovascular: Patient denies chest pain or syncope Respiratory: Patient denies shortness of breath Gastrointestinal: Patient denies nausea, vomiting, constipation, or diarrhea Musculoskeletal: Patient denies muscle cramps or weakness Neurologic: Patient denies convulsions or seizures Allergic/Immunologic: Patient denies recent allergic reaction(s) Hematologic/Lymphatic: Patient denies bleeding tendencies Endocrine: Patient denies heat/cold intolerance  GU: As per HPI.  OBJECTIVE Vitals:   10/18/23 1142  BP: 119/75   Pulse: 84   There is no height or weight on file to calculate BMI.  Physical Examination Constitutional: No obvious distress; patient is non-toxic appearing  Cardiovascular: No visible lower extremity edema.  Respiratory: The patient does not have audible wheezing/stridor; respirations do not appear labored  Gastrointestinal: Abdomen non-distended Musculoskeletal: Normal ROM of UEs  Skin: No obvious rashes/open sores  Neurologic: CN 2-12 grossly intact Psychiatric: Answered questions appropriately with normal affect  Hematologic/Lymphatic/Immunologic: No obvious bruises or sites of spontaneous bleeding  UA: no evidence of UTI or microscopic hematuria PVR: 99 ml  ASSESSMENT Benign prostatic hyperplasia with nocturia - Plan: Urinalysis, Routine w reflex microscopic, BLADDER SCAN AMB NON-IMAGING, PSA  Incomplete bladder emptying - Plan: BLADDER SCAN AMB NON-IMAGING  Elevated prostate specific antigen (PSA) - Plan: PSA  He is doing well. Advised to complete Bactrim as prescribed. We discussed that his residual LUTS (nocturia) may improve with time. Will plan for follow up in  3 months with Dr. Ronne Binning with PSA prior or sooner if needed. Pt verbalized understanding and agreement. All questions were answered.  PLAN Advised the following: 1. Complete Bactrim course. 2. Return in about 3 months (around 01/18/2024) for f/u with Dr. Ronne Binning with PSA check prior.  Orders Placed This Encounter  Procedures   Urinalysis, Routine w reflex microscopic   PSA    Standing Status:   Future    Standing Expiration Date:   10/17/2024   BLADDER SCAN AMB NON-IMAGING    It has been explained that the patient is to follow regularly with their PCP in addition to all other providers involved in their care and to follow instructions provided by these respective offices. Patient advised to contact urology clinic if any urologic-pertaining questions, concerns, new symptoms or problems arise in the interim  period.  There are no Patient Instructions on file for this visit.  Electronically signed by:  Donnita Falls, FNP   10/18/23    12:43 PM

## 2023-10-18 NOTE — Progress Notes (Signed)
Uroflow  Peak Flow: 4ml Average Flow: 3ml Voided Volume: 99ml Voiding Time: 61sec Flow Time: 29sec Time to Peak Flow: 1sec  PVR Volume: 99ml

## 2023-10-19 DIAGNOSIS — D225 Melanocytic nevi of trunk: Secondary | ICD-10-CM | POA: Diagnosis not present

## 2023-10-19 DIAGNOSIS — L820 Inflamed seborrheic keratosis: Secondary | ICD-10-CM | POA: Diagnosis not present

## 2023-10-19 DIAGNOSIS — L718 Other rosacea: Secondary | ICD-10-CM | POA: Diagnosis not present

## 2023-10-19 DIAGNOSIS — L0202 Furuncle of face: Secondary | ICD-10-CM | POA: Diagnosis not present

## 2023-10-19 DIAGNOSIS — L570 Actinic keratosis: Secondary | ICD-10-CM | POA: Diagnosis not present

## 2023-10-19 DIAGNOSIS — R208 Other disturbances of skin sensation: Secondary | ICD-10-CM | POA: Diagnosis not present

## 2023-10-19 DIAGNOSIS — L821 Other seborrheic keratosis: Secondary | ICD-10-CM | POA: Diagnosis not present

## 2023-10-19 DIAGNOSIS — L814 Other melanin hyperpigmentation: Secondary | ICD-10-CM | POA: Diagnosis not present

## 2023-10-26 ENCOUNTER — Other Ambulatory Visit: Payer: Medicare HMO

## 2023-11-02 ENCOUNTER — Ambulatory Visit: Payer: Medicare HMO | Admitting: Urology

## 2023-12-03 DIAGNOSIS — H7293 Unspecified perforation of tympanic membrane, bilateral: Secondary | ICD-10-CM | POA: Diagnosis not present

## 2023-12-03 DIAGNOSIS — H6121 Impacted cerumen, right ear: Secondary | ICD-10-CM | POA: Diagnosis not present

## 2023-12-03 DIAGNOSIS — H906 Mixed conductive and sensorineural hearing loss, bilateral: Secondary | ICD-10-CM | POA: Diagnosis not present

## 2024-01-11 ENCOUNTER — Other Ambulatory Visit: Payer: Medicare HMO

## 2024-01-11 DIAGNOSIS — N401 Enlarged prostate with lower urinary tract symptoms: Secondary | ICD-10-CM | POA: Diagnosis not present

## 2024-01-11 DIAGNOSIS — R972 Elevated prostate specific antigen [PSA]: Secondary | ICD-10-CM

## 2024-01-11 DIAGNOSIS — R351 Nocturia: Secondary | ICD-10-CM | POA: Diagnosis not present

## 2024-01-12 LAB — PSA: Prostate Specific Ag, Serum: 9.6 ng/mL — ABNORMAL HIGH (ref 0.0–4.0)

## 2024-01-18 ENCOUNTER — Ambulatory Visit: Payer: Medicare HMO | Admitting: Urology

## 2024-01-18 VITALS — BP 118/74 | HR 85

## 2024-01-18 DIAGNOSIS — R339 Retention of urine, unspecified: Secondary | ICD-10-CM | POA: Diagnosis not present

## 2024-01-18 DIAGNOSIS — R972 Elevated prostate specific antigen [PSA]: Secondary | ICD-10-CM

## 2024-01-18 DIAGNOSIS — N401 Enlarged prostate with lower urinary tract symptoms: Secondary | ICD-10-CM

## 2024-01-18 DIAGNOSIS — R351 Nocturia: Secondary | ICD-10-CM

## 2024-01-18 DIAGNOSIS — N411 Chronic prostatitis: Secondary | ICD-10-CM

## 2024-01-18 LAB — URINALYSIS, ROUTINE W REFLEX MICROSCOPIC
Bilirubin, UA: NEGATIVE
Glucose, UA: NEGATIVE
Ketones, UA: NEGATIVE
Leukocytes,UA: NEGATIVE
Nitrite, UA: NEGATIVE
Protein,UA: NEGATIVE
Specific Gravity, UA: 1.025 (ref 1.005–1.030)
Urobilinogen, Ur: 0.2 mg/dL (ref 0.2–1.0)
pH, UA: 6 (ref 5.0–7.5)

## 2024-01-18 LAB — MICROSCOPIC EXAMINATION
Bacteria, UA: NONE SEEN
WBC, UA: NONE SEEN /[HPF] (ref 0–5)

## 2024-01-18 MED ORDER — SULFAMETHOXAZOLE-TRIMETHOPRIM 800-160 MG PO TABS: 1.0000 | ORAL_TABLET | Freq: Two times a day (BID) | ORAL | 0 refills | Status: AC

## 2024-01-18 NOTE — Progress Notes (Signed)
 01/18/2024 10:56 AM   Jeremy Casey [birth year] [id number]  Referring provider: Seabron Lenis, MD [phone] [address] [place],  [place] [address]  Pelvic pain   HPI: Jeremy Casey is a 72yo here for followup for protatitis, BPH and elevated PSA. PSA increased to 9.6. For the past 2 weeks he has noted worsening pelvic pain and is concerned he has a prostate infection. Since Urolift his nocturia and urinary frequency improved. IPSS 3 QOL 1 after urolift.    PMH: Past Medical History:  Diagnosis Date   Crohn's disease (HCC)    Elevated PSA    Essential hypertension, benign    [facility]   Seasonal allergies     Surgical History: Past Surgical History:  Procedure Laterality Date   CYSTOSCOPY WITH INSERTION OF UROLIFT N/A 09/10/2023   Procedure: CYSTOSCOPY WITH INSERTION OF UROLIFT;  Surgeon: Sherrilee Belvie CROME, MD;  Location: [facility];  Service: Urology;  Laterality: N/A;   HIP SURGERY     KNEE SURGERY     MOUTH SURGERY  2014   repeat from root canal, 2 teeth pulled   PE tube placement  2002   secondary to serous otitis   ROOT CANAL  2013   failed    Home Medications:  Allergies as of 01/18/2024       Reactions   Tamsulosin Hcl Other (See Comments)   Drop in blood pressure    Amoxicillin Other (See Comments)   Gi- Upset        Medication List        Accurate as of January 18, 2024 10:56 AM. If you have any questions, ask your nurse or doctor.          aspirin 81 MG chewable tablet 1 tablet   clindamycin 1 % gel Commonly known as: CLINDAGEL Apply 1 Application topically daily as needed (pre skin cancer).   fluticasone 0.05 % cream Commonly known as: CUTIVATE Apply 1 Application topically daily as needed (pre cancer on face).   fluticasone 50 MCG/ACT nasal spray Commonly known as: FLONASE Place 2 sprays into both nostrils daily.   loratadine 10 MG tablet Commonly known as: CLARITIN Take 10 mg by mouth daily.   metroNIDAZOLE 0.75 % cream Commonly  known as: METROCREAM Apply 1 Application topically daily as needed (pre cancer).   olmesartan-hydrochlorothiazide 20-12.5 MG tablet Commonly known as: BENICAR HCT Take 0.5 tablets by mouth daily.   traMADol  50 MG tablet Commonly known as: Ultram  Take 1 tablet (50 mg total) by mouth every 6 (six) hours as needed.        Allergies:  Allergies  Allergen Reactions   Tamsulosin Hcl Other (See Comments)    Drop in blood pressure    Amoxicillin Other (See Comments)    Gi- Upset    Family History: Family History  Problem Relation Age of Onset   Hodgkin's lymphoma Father    Cancer Father    ALS Mother    Hypertension Brother    Heart attack Unknown     Social History:  reports that he has never smoked. He has never used smokeless tobacco. He reports that he does not currently use alcohol. He reports that he does not use drugs.  ROS: All other review of systems were reviewed and are negative except what is noted above in HPI  Physical Exam: BP 118/74   Pulse 85   Constitutional:  Alert and oriented, No acute distress. HEENT: [place] AT, moist mucus membranes.  Trachea  midline, no masses. Cardiovascular: No clubbing, cyanosis, or edema. Respiratory: Normal respiratory effort, no increased work of breathing. GI: Abdomen is soft, nontender, nondistended, no abdominal masses GU: No CVA tenderness.  Lymph: No cervical or inguinal lymphadenopathy. Skin: No rashes, bruises or suspicious lesions. Neurologic: Grossly intact, no focal deficits, moving all 4 extremities. Psychiatric: Normal mood and affect.  Laboratory Data: Lab Results  Component Value Date   WBC 7.3 01/26/2022   HGB 15.1 01/26/2022   HCT 45.2 01/26/2022   MCV 91 01/26/2022   PLT 195 10/16/2016    Lab Results  Component Value Date   CREATININE 1.19 09/10/2023    No results found for: PSA  No results found for: TESTOSTERONE  No results found for: HGBA1C  Urinalysis    Component Value Date/Time    COLORURINE YELLOW 10/16/2016 2320   APPEARANCEUR Clear 10/18/2023 1147   LABSPEC 1.010 10/16/2016 2320   PHURINE 5.5 10/16/2016 2320   GLUCOSEU Negative 10/18/2023 1147   HGBUR TRACE (A) 10/16/2016 2320   BILIRUBINUR Negative 10/18/2023 1147   KETONESUR negative 08/11/2023 1329   KETONESUR 40 (A) 10/16/2016 2320   PROTEINUR Negative 10/18/2023 1147   PROTEINUR NEGATIVE 10/16/2016 2320   UROBILINOGEN 0.2 08/11/2023 1329   NITRITE Negative 10/18/2023 1147   NITRITE NEGATIVE 10/16/2016 2320   LEUKOCYTESUR Trace (A) 10/18/2023 1147    Lab Results  Component Value Date   LABMICR See below: 10/18/2023   WBCUA 0-5 10/18/2023   LABEPIT 0-10 10/18/2023   MUCUS Present 03/30/2023   BACTERIA None seen 10/18/2023    Pertinent Imaging:  No results found for this or any previous visit.  No results found for this or any previous visit.  No results found for this or any previous visit.  No results found for this or any previous visit.  No results found for this or any previous visit.  No results found for this or any previous visit.  No results found for this or any previous visit.  No results found for this or any previous visit.   Assessment & Plan:    1. Elevated prostate specific antigen (PSA) (Primary) Repeat PSA in 3 months - Urinalysis, Routine w reflex microscopic  2. Incomplete bladder emptying Likely related to prostatitis. We will treat with bactrim  for 28 days then I will see him back to assess treatment response.  3. Benign prostatic hyperplasia with nocturia Bactrim  DS BID for 28 days   No follow-ups on file.  Belvie Clara, MD  [facility] [place]

## 2024-01-22 ENCOUNTER — Encounter: Payer: Self-pay | Admitting: Urology

## 2024-01-22 NOTE — Patient Instructions (Signed)
 Prostatitis  Prostatitis is swelling of the prostate gland, also called the prostate. This gland is about 1.5 inches wide and 1 inch high, and it helps to make a fluid called semen. The prostate is below a man's bladder, in front of the butt (rectum). There are different types of prostatitis. What are the causes? One type of prostatitis is caused by an infection from germs (bacteria). Another type is not caused by germs. It may be caused by: Things having to do with the nervous system. This system includes thebrain, spinal cord, and nerves. An autoimmune response. This happens when the body's disease-fighting system attacks healthy tissue in the body by mistake. Psychological factors. These have to do with how the mind works. The causes of other types of prostatitis are normally not known. What are the signs or symptoms? Symptoms of this condition depend on the type of prostatitis you have. If your condition is caused by germs: You may feel pain or burning when you pee (urinate). You may pee often and all of a sudden. You may have problems starting to pee. You may have trouble emptying your bladder when you pee. You may have fever or chills. You may feel pain in your muscles, joints, low back, or lower belly. If you have other types of prostatitis: You may pee often or all of a sudden. You may have trouble starting to pee. You may have a weak flow when you pee. You may leak pee after using the bathroom. You may have other problems, such as: Abnormal fluid coming from the penis. Pain in the testicles or penis. Pain between the butt and the testicles. Pain when fluid comes out of the penis during sex. How is this treated? Treatment for this condition depends on the type of prostatitis. Treatment may include: Medicines. These may treat pain or swelling, or they may help relax muscles. Exercises to help you move better or get stronger (physical therapy). Heat therapy. Techniques to help  you control some of the ways that your body works. Exercises to help you relax. Antibiotic medicine, if your condition is caused by germs. Warm water baths (sitz baths) to relax muscles. Follow these instructions at home: Medicines Take over-the-counter and prescription medicines only as told by your doctor. If you were prescribed an antibiotic medicine, take it as told by your doctor. Do not stop using the antibiotic even if you start to feel better. Managing pain and swelling  Take sitz baths as told by your doctor. For a sitz bath, sit in warm water that is deep enough to cover your hips and butt. If told, put heat on the painful area. Do this as often as told by your doctor. Use the heat source that your doctor recommends, such as a moist heat pack or a heating pad. Place a towel between your skin and the heat source. Leave the heat on for 20-30 minutes. Take off the heat if your skin turns bright red. This is very important if you are unable to feel pain, heat, or cold. You may have a greater risk of getting burned. General instructions Do exercises as told by your doctor, if your doctor prescribed them. Keep all follow-up visits as told by your doctor. This is important. Where to find more information [employer] of Diabetes and Digestive and Kidney Diseases: LowApproval.se Contact a doctor if: Your symptoms get worse. You have a fever. Get help right away if: You have chills. You feel light-headed. You feel like you may  faint. You cannot pee. You have blood or clumps of blood (blood clots) in your pee. Summary Prostatitis is swelling of the prostate gland. There are different types of prostatitis. Treatment depends on the type that you have. Take over-the-counter and prescription medicines only as told by your doctor. Get help right away of you have chills, feel light-headed, or feel like you may faint. Also get help right away if you cannot pee or you have  blood or clumps of blood in your pee. This information is not intended to replace advice given to you by your health care provider. Make sure you discuss any questions you have with your health care provider. Document Revised: 10/12/2022 Document Reviewed: 10/12/2022 Elsevier Patient Education  2024 [employer].

## 2024-03-04 ENCOUNTER — Telehealth: Payer: Self-pay | Admitting: Urology

## 2024-03-04 NOTE — Telephone Encounter (Signed)
 Finished antibiotic in early March, symptoms are getting worse. He is aching in the prostate area when he urinates.

## 2024-03-12 ENCOUNTER — Ambulatory Visit: Admitting: Urology

## 2024-03-12 VITALS — BP 144/79 | HR 90

## 2024-03-12 DIAGNOSIS — R300 Dysuria: Secondary | ICD-10-CM

## 2024-03-12 DIAGNOSIS — R339 Retention of urine, unspecified: Secondary | ICD-10-CM

## 2024-03-12 DIAGNOSIS — N400 Enlarged prostate without lower urinary tract symptoms: Secondary | ICD-10-CM | POA: Diagnosis not present

## 2024-03-12 MED ORDER — CIPROFLOXACIN HCL 500 MG PO TABS
500.0000 mg | ORAL_TABLET | Freq: Once | ORAL | Status: AC
  Administered 2024-03-12: 500 mg via ORAL

## 2024-03-12 MED ORDER — CEFUROXIME AXETIL 500 MG PO TABS: 500.0000 mg | ORAL_TABLET | Freq: Two times a day (BID) | ORAL | 0 refills | Status: DC

## 2024-03-12 NOTE — Progress Notes (Signed)
   03/12/24  CC: dysuria   HPI: Jeremy Casey is a 72yo here for cystoscoyp for persistent dysuria Blood pressure (!) 144/79, pulse 90. NED. A&Ox3.   No respiratory distress   Abd soft, NT, ND Normal phallus with bilateral descended testicles  Cystoscopy Procedure Note  Patient identification was confirmed, informed consent was obtained, and patient was prepped using Betadine solution.  Lidocaine jelly was administered per urethral meatus.     Pre-Procedure: - Inspection reveals a normal caliber ureteral meatus.  Procedure: The flexible cystoscope was introduced without difficulty - No urethral strictures/lesions are present. - Enlarged prostate erythematous prostatic urethra - Normal bladder neck - Bilateral ureteral orifices identified - Bladder mucosa  reveals no ulcers, tumors, or lesions - No bladder stones - No trabeculation     Post-Procedure: - Patient tolerated the procedure well  Assessment/ Plan: Ceftin 500mg  BID for 21 days  No follow-ups on file.  Wilkie Aye, MD

## 2024-03-13 LAB — URINALYSIS, ROUTINE W REFLEX MICROSCOPIC
Bilirubin, UA: NEGATIVE
Glucose, UA: NEGATIVE
Ketones, UA: NEGATIVE
Leukocytes,UA: NEGATIVE
Nitrite, UA: NEGATIVE
Protein,UA: NEGATIVE
Specific Gravity, UA: 1.025 (ref 1.005–1.030)
Urobilinogen, Ur: 0.2 mg/dL (ref 0.2–1.0)
pH, UA: 6 (ref 5.0–7.5)

## 2024-03-13 LAB — MICROSCOPIC EXAMINATION
Bacteria, UA: NONE SEEN
Epithelial Cells (non renal): NONE SEEN /HPF (ref 0–10)
WBC, UA: NONE SEEN /HPF (ref 0–5)

## 2024-03-20 ENCOUNTER — Encounter: Payer: Self-pay | Admitting: Urology

## 2024-03-20 NOTE — Patient Instructions (Signed)
 Prostatitis  Prostatitis is swelling of the prostate gland, also called the prostate. This gland is about 1.5 inches wide and 1 inch high, and it helps to make a fluid called semen. The prostate is below a man's bladder, in front of the butt (rectum). There are different types of prostatitis. What are the causes? One type of prostatitis is caused by an infection from germs (bacteria). Another type is not caused by germs. It may be caused by: Things having to do with the nervous system. This system includes thebrain, spinal cord, and nerves. An autoimmune response. This happens when the body's disease-fighting system attacks healthy tissue in the body by mistake. Psychological factors. These have to do with how the mind works. The causes of other types of prostatitis are normally not known. What are the signs or symptoms? Symptoms of this condition depend on the type of prostatitis you have. If your condition is caused by germs: You may feel pain or burning when you pee (urinate). You may pee often and all of a sudden. You may have problems starting to pee. You may have trouble emptying your bladder when you pee. You may have fever or chills. You may feel pain in your muscles, joints, low back, or lower belly. If you have other types of prostatitis: You may pee often or all of a sudden. You may have trouble starting to pee. You may have a weak flow when you pee. You may leak pee after using the bathroom. You may have other problems, such as: Abnormal fluid coming from the penis. Pain in the testicles or penis. Pain between the butt and the testicles. Pain when fluid comes out of the penis during sex. How is this treated? Treatment for this condition depends on the type of prostatitis. Treatment may include: Medicines. These may treat pain or swelling, or they may help relax muscles. Exercises to help you move better or get stronger (physical therapy). Heat therapy. Techniques to help  you control some of the ways that your body works. Exercises to help you relax. Antibiotic medicine, if your condition is caused by germs. Warm water baths (sitz baths) to relax muscles. Follow these instructions at home: Medicines Take over-the-counter and prescription medicines only as told by your doctor. If you were prescribed an antibiotic medicine, take it as told by your doctor. Do not stop using the antibiotic even if you start to feel better. Managing pain and swelling  Take sitz baths as told by your doctor. For a sitz bath, sit in warm water that is deep enough to cover your hips and butt. If told, put heat on the painful area. Do this as often as told by your doctor. Use the heat source that your doctor recommends, such as a moist heat pack or a heating pad. Place a towel between your skin and the heat source. Leave the heat on for 20-30 minutes. Take off the heat if your skin turns bright red. This is very important if you are unable to feel pain, heat, or cold. You may have a greater risk of getting burned. General instructions Do exercises as told by your doctor, if your doctor prescribed them. Keep all follow-up visits as told by your doctor. This is important. Where to find more information [employer] of Diabetes and Digestive and Kidney Diseases: LowApproval.se Contact a doctor if: Your symptoms get worse. You have a fever. Get help right away if: You have chills. You feel light-headed. You feel like you may  faint. You cannot pee. You have blood or clumps of blood (blood clots) in your pee. Summary Prostatitis is swelling of the prostate gland. There are different types of prostatitis. Treatment depends on the type that you have. Take over-the-counter and prescription medicines only as told by your doctor. Get help right away of you have chills, feel light-headed, or feel like you may faint. Also get help right away if you cannot pee or you have  blood or clumps of blood in your pee. This information is not intended to replace advice given to you by your health care provider. Make sure you discuss any questions you have with your health care provider. Document Revised: 10/12/2022 Document Reviewed: 10/12/2022 Elsevier Patient Education  2024 [employer].

## 2024-04-11 ENCOUNTER — Other Ambulatory Visit: Payer: Medicare HMO

## 2024-04-11 DIAGNOSIS — R972 Elevated prostate specific antigen [PSA]: Secondary | ICD-10-CM | POA: Diagnosis not present

## 2024-04-12 LAB — PSA: Prostate Specific Ag, Serum: 7.6 ng/mL — ABNORMAL HIGH (ref 0.0–4.0)

## 2024-04-18 ENCOUNTER — Ambulatory Visit: Payer: Medicare HMO | Admitting: Urology

## 2024-04-18 DIAGNOSIS — L820 Inflamed seborrheic keratosis: Secondary | ICD-10-CM | POA: Diagnosis not present

## 2024-04-18 DIAGNOSIS — L814 Other melanin hyperpigmentation: Secondary | ICD-10-CM | POA: Diagnosis not present

## 2024-04-18 DIAGNOSIS — L538 Other specified erythematous conditions: Secondary | ICD-10-CM | POA: Diagnosis not present

## 2024-04-18 DIAGNOSIS — L821 Other seborrheic keratosis: Secondary | ICD-10-CM | POA: Diagnosis not present

## 2024-04-18 DIAGNOSIS — R208 Other disturbances of skin sensation: Secondary | ICD-10-CM | POA: Diagnosis not present

## 2024-04-18 DIAGNOSIS — L570 Actinic keratosis: Secondary | ICD-10-CM | POA: Diagnosis not present

## 2024-04-18 DIAGNOSIS — L718 Other rosacea: Secondary | ICD-10-CM | POA: Diagnosis not present

## 2024-04-23 ENCOUNTER — Other Ambulatory Visit: Admitting: Urology

## 2024-04-25 ENCOUNTER — Ambulatory Visit: Payer: Medicare HMO | Admitting: Urology

## 2024-04-25 ENCOUNTER — Encounter: Payer: Self-pay | Admitting: Urology

## 2024-04-25 ENCOUNTER — Ambulatory Visit: Admitting: Urology

## 2024-04-25 VITALS — BP 118/66 | HR 88

## 2024-04-25 DIAGNOSIS — R351 Nocturia: Secondary | ICD-10-CM

## 2024-04-25 DIAGNOSIS — N401 Enlarged prostate with lower urinary tract symptoms: Secondary | ICD-10-CM

## 2024-04-25 DIAGNOSIS — R339 Retention of urine, unspecified: Secondary | ICD-10-CM

## 2024-04-25 DIAGNOSIS — R338 Other retention of urine: Secondary | ICD-10-CM

## 2024-04-25 DIAGNOSIS — R972 Elevated prostate specific antigen [PSA]: Secondary | ICD-10-CM | POA: Diagnosis not present

## 2024-04-25 LAB — URINALYSIS, ROUTINE W REFLEX MICROSCOPIC
Bilirubin, UA: NEGATIVE
Glucose, UA: NEGATIVE
Ketones, UA: NEGATIVE
Leukocytes,UA: NEGATIVE
Nitrite, UA: NEGATIVE
Protein,UA: NEGATIVE
Specific Gravity, UA: 1.02 (ref 1.005–1.030)
Urobilinogen, Ur: 0.2 mg/dL (ref 0.2–1.0)
pH, UA: 6 (ref 5.0–7.5)

## 2024-04-25 LAB — MICROSCOPIC EXAMINATION: Bacteria, UA: NONE SEEN

## 2024-04-25 NOTE — Patient Instructions (Signed)

## 2024-04-25 NOTE — Progress Notes (Signed)
 04/25/2024 10:45 AM   Laureen Pon [birth year] [id number]  Referring provider: Rae Bugler, MD [phone] [address] [place],  [place] [address]  Elevated PSA   HPI: Mr Krauser is 72yo here for followup for elevated PSA and BPH. PSA decreased to 7.6 from 9.6. IPSS 8 QOL 3 on no BPh therapy. His LUTS improved after Urolift. Urine stream strong. Nocturia 3x. He has suprapubic aching after urinating at night. No dysuria or hematuria. His LUTS improved after bactrim  and ceftin  therapy for prostatitis.  PMH: Past Medical History:  Diagnosis Date   Crohn's disease (HCC)    Elevated PSA    Essential hypertension, benign    [facility]   Seasonal allergies     Surgical History: Past Surgical History:  Procedure Laterality Date   CYSTOSCOPY WITH INSERTION OF UROLIFT N/A 09/10/2023   Procedure: CYSTOSCOPY WITH INSERTION OF UROLIFT;  Surgeon: Marco Severs, MD;  Location: [facility];  Service: Urology;  Laterality: N/A;   HIP SURGERY     KNEE SURGERY     MOUTH SURGERY  2014   repeat from root canal, 2 teeth pulled   PE tube placement  2002   secondary to serous otitis   ROOT CANAL  2013   failed    Home Medications:  Allergies as of 04/25/2024       Reactions   Tamsulosin Hcl Other (See Comments)   Drop in blood pressure    Amoxicillin Other (See Comments)   Gi- Upset        Medication List        Accurate as of Apr 25, 2024 10:45 AM. If you have any questions, ask your nurse or doctor.          aspirin 81 MG chewable tablet 1 tablet   cefUROXime  500 MG tablet Commonly known as: CEFTIN  Take 1 tablet (500 mg total) by mouth 2 (two) times daily with a meal.   clindamycin 1 % gel Commonly known as: CLINDAGEL Apply 1 Application topically daily as needed (pre skin cancer).   fluticasone 0.05 % cream Commonly known as: CUTIVATE Apply 1 Application topically daily as needed (pre cancer on face).   fluticasone 50 MCG/ACT nasal spray Commonly known as:  FLONASE Place 2 sprays into both nostrils daily.   loratadine 10 MG tablet Commonly known as: CLARITIN Take 10 mg by mouth daily.   metroNIDAZOLE 0.75 % cream Commonly known as: METROCREAM Apply 1 Application topically daily as needed (pre cancer).   olmesartan-hydrochlorothiazide 20-12.5 MG tablet Commonly known as: BENICAR HCT Take 0.5 tablets by mouth daily.   sulfamethoxazole -trimethoprim  800-160 MG tablet Commonly known as: BACTRIM  DS Take 1 tablet by mouth every 12 (twelve) hours.   traMADol  50 MG tablet Commonly known as: Ultram  Take 1 tablet (50 mg total) by mouth every 6 (six) hours as needed.        Allergies:  Allergies  Allergen Reactions   Tamsulosin Hcl Other (See Comments)    Drop in blood pressure    Amoxicillin Other (See Comments)    Gi- Upset    Family History: Family History  Problem Relation Age of Onset   Hodgkin's lymphoma Father    Cancer Father    ALS Mother    Hypertension Brother    Heart attack Unknown     Social History:  reports that he has never smoked. He has never used smokeless tobacco. He reports that he does not currently use alcohol. He reports that he  does not use drugs.  ROS: All other review of systems were reviewed and are negative except what is noted above in HPI  Physical Exam: BP 118/66   Pulse 88   Constitutional:  Alert and oriented, No acute distress. HEENT: [place] AT, moist mucus membranes.  Trachea midline, no masses. Cardiovascular: No clubbing, cyanosis, or edema. Respiratory: Normal respiratory effort, no increased work of breathing. GI: Abdomen is soft, nontender, nondistended, no abdominal masses GU: No CVA tenderness.  Lymph: No cervical or inguinal lymphadenopathy. Skin: No rashes, bruises or suspicious lesions. Neurologic: Grossly intact, no focal deficits, moving all 4 extremities. Psychiatric: Normal mood and affect.  Laboratory Data: Lab Results  Component Value Date   WBC 7.3 01/26/2022    HGB 15.1 01/26/2022   HCT 45.2 01/26/2022   MCV 91 01/26/2022   PLT 195 10/16/2016    Lab Results  Component Value Date   CREATININE 1.19 09/10/2023    No results found for: "PSA"  No results found for: "TESTOSTERONE"  No results found for: "HGBA1C"  Urinalysis    Component Value Date/Time   COLORURINE YELLOW 10/16/2016 2320   APPEARANCEUR Clear 03/12/2024 1345   LABSPEC 1.010 10/16/2016 2320   PHURINE 5.5 10/16/2016 2320   GLUCOSEU Negative 03/12/2024 1345   HGBUR TRACE (A) 10/16/2016 2320   BILIRUBINUR Negative 03/12/2024 1345   KETONESUR negative 08/11/2023 1329   KETONESUR 40 (A) 10/16/2016 2320   PROTEINUR Negative 03/12/2024 1345   PROTEINUR NEGATIVE 10/16/2016 2320   UROBILINOGEN 0.2 08/11/2023 1329   NITRITE Negative 03/12/2024 1345   NITRITE NEGATIVE 10/16/2016 2320   LEUKOCYTESUR Negative 03/12/2024 1345    Lab Results  Component Value Date   LABMICR See below: 03/12/2024   WBCUA None seen 03/12/2024   LABEPIT None seen 03/12/2024   MUCUS Present 03/30/2023   BACTERIA None seen 03/12/2024    Pertinent Imaging:  No results found for this or any previous visit.  No results found for this or any previous visit.  No results found for this or any previous visit.  No results found for this or any previous visit.  No results found for this or any previous visit.  No results found for this or any previous visit.  No results found for this or any previous visit.  No results found for this or any previous visit.   Assessment & Plan:    1. Elevated prostate specific antigen (PSA) (Primary) -IsoPSA, if normal he will followup in 6 months with a PSA. If it is elevated he will need prostate MRI - Urinalysis, Routine w reflex microscopic  2. Incomplete bladder emptying -improved after urolift  3. Benign prostatic hyperplasia with nocturia Improved after Urolift   No follow-ups on file.  Johnie Nailer, MD  [facility] [place]

## 2024-05-01 ENCOUNTER — Telehealth: Payer: Self-pay

## 2024-05-01 DIAGNOSIS — R972 Elevated prostate specific antigen [PSA]: Secondary | ICD-10-CM

## 2024-05-01 NOTE — Telephone Encounter (Signed)
-----   Message from Johnie Nailer sent at 04/29/2024  9:02 AM EDT ----- Prostate MRI ----- Message ----- From: Dyke Glasser, CMA Sent: 04/28/2024   4:25 PM EDT To: Marco Severs, MD; [facility], LPN  ISO PSA results

## 2024-05-01 NOTE — Telephone Encounter (Signed)
 Order placed. Patient called with no answer. Detailed message left.

## 2024-05-06 ENCOUNTER — Telehealth: Payer: Self-pay | Admitting: Urology

## 2024-05-06 NOTE — Telephone Encounter (Signed)
 Thinks he has prostate infection He has aching and burning when urinating and after. His urination frequency has increased. He leaves Saturday for a week vacation and wanted to see if he could get something before then.

## 2024-05-06 NOTE — Telephone Encounter (Signed)
 Will you send in abx?  Please see below and advise.

## 2024-05-07 ENCOUNTER — Ambulatory Visit (HOSPITAL_COMMUNITY)
Admission: RE | Admit: 2024-05-07 | Discharge: 2024-05-07 | Disposition: A | Discharge status: Home / Self Care | Source: Ambulatory Visit | Attending: Urology | Admitting: Urology

## 2024-05-07 ENCOUNTER — Other Ambulatory Visit: Payer: Self-pay

## 2024-05-07 DIAGNOSIS — R972 Elevated prostate specific antigen [PSA]: Secondary | ICD-10-CM | POA: Diagnosis not present

## 2024-05-07 DIAGNOSIS — K5730 Diverticulosis of large intestine without perforation or abscess without bleeding: Secondary | ICD-10-CM | POA: Diagnosis not present

## 2024-05-07 DIAGNOSIS — N411 Chronic prostatitis: Secondary | ICD-10-CM

## 2024-05-07 DIAGNOSIS — N400 Enlarged prostate without lower urinary tract symptoms: Secondary | ICD-10-CM | POA: Diagnosis not present

## 2024-05-07 MED ORDER — GADOBUTROL 1 MMOL/ML IV SOLN
9.0000 mL | Freq: Once | INTRAVENOUS | Status: AC | PRN
  Administered 2024-05-07: 9 mL via INTRAVENOUS

## 2024-05-07 MED ORDER — CEFUROXIME AXETIL 500 MG PO TABS: 500.0000 mg | ORAL_TABLET | Freq: Two times a day (BID) | ORAL | 0 refills | Status: DC

## 2024-05-07 NOTE — Telephone Encounter (Signed)
 Verbal from Dr. Claretta Croft to refill Ceftin  rx.  Rx sent to pharmacy and patient notified.

## 2024-05-07 NOTE — Telephone Encounter (Signed)
 Mr called back wanting an antibiotic

## 2024-05-13 ENCOUNTER — Ambulatory Visit: Payer: Self-pay

## 2024-06-27 ENCOUNTER — Encounter: Payer: Self-pay | Admitting: Advanced Practice Midwife

## 2024-10-16 DIAGNOSIS — I10 Essential (primary) hypertension: Secondary | ICD-10-CM | POA: Diagnosis not present

## 2024-10-16 DIAGNOSIS — R053 Chronic cough: Secondary | ICD-10-CM | POA: Diagnosis not present

## 2024-10-16 DIAGNOSIS — K5090 Crohn's disease, unspecified, without complications: Secondary | ICD-10-CM | POA: Diagnosis not present

## 2024-10-16 DIAGNOSIS — J309 Allergic rhinitis, unspecified: Secondary | ICD-10-CM | POA: Diagnosis not present

## 2024-10-16 DIAGNOSIS — N1831 Chronic kidney disease, stage 3a: Secondary | ICD-10-CM | POA: Diagnosis not present

## 2024-10-16 DIAGNOSIS — E7800 Pure hypercholesterolemia, unspecified: Secondary | ICD-10-CM | POA: Diagnosis not present

## 2024-10-16 DIAGNOSIS — Z1331 Encounter for screening for depression: Secondary | ICD-10-CM | POA: Diagnosis not present

## 2024-10-16 DIAGNOSIS — Z23 Encounter for immunization: Secondary | ICD-10-CM | POA: Diagnosis not present

## 2024-10-16 DIAGNOSIS — Z0000 Encounter for general adult medical examination without abnormal findings: Secondary | ICD-10-CM | POA: Diagnosis not present

## 2024-10-16 DIAGNOSIS — R972 Elevated prostate specific antigen [PSA]: Secondary | ICD-10-CM | POA: Diagnosis not present

## 2024-10-16 DIAGNOSIS — M545 Low back pain, unspecified: Secondary | ICD-10-CM | POA: Diagnosis not present

## 2024-10-24 DIAGNOSIS — Z08 Encounter for follow-up examination after completed treatment for malignant neoplasm: Secondary | ICD-10-CM | POA: Diagnosis not present

## 2024-10-24 DIAGNOSIS — L570 Actinic keratosis: Secondary | ICD-10-CM | POA: Diagnosis not present

## 2024-10-24 DIAGNOSIS — L218 Other seborrheic dermatitis: Secondary | ICD-10-CM | POA: Diagnosis not present

## 2024-10-24 DIAGNOSIS — D1801 Hemangioma of skin and subcutaneous tissue: Secondary | ICD-10-CM | POA: Diagnosis not present

## 2024-10-24 DIAGNOSIS — L538 Other specified erythematous conditions: Secondary | ICD-10-CM | POA: Diagnosis not present

## 2024-10-24 DIAGNOSIS — R208 Other disturbances of skin sensation: Secondary | ICD-10-CM | POA: Diagnosis not present

## 2024-10-24 DIAGNOSIS — L814 Other melanin hyperpigmentation: Secondary | ICD-10-CM | POA: Diagnosis not present

## 2024-10-24 DIAGNOSIS — L718 Other rosacea: Secondary | ICD-10-CM | POA: Diagnosis not present

## 2024-10-24 DIAGNOSIS — L308 Other specified dermatitis: Secondary | ICD-10-CM | POA: Diagnosis not present

## 2024-10-24 DIAGNOSIS — L821 Other seborrheic keratosis: Secondary | ICD-10-CM | POA: Diagnosis not present

## 2024-10-24 DIAGNOSIS — Z85828 Personal history of other malignant neoplasm of skin: Secondary | ICD-10-CM | POA: Diagnosis not present

## 2024-10-24 DIAGNOSIS — L820 Inflamed seborrheic keratosis: Secondary | ICD-10-CM | POA: Diagnosis not present

## 2024-10-30 ENCOUNTER — Other Ambulatory Visit

## 2024-10-30 DIAGNOSIS — R972 Elevated prostate specific antigen [PSA]: Secondary | ICD-10-CM | POA: Diagnosis not present

## 2024-10-31 LAB — PSA, TOTAL AND FREE
PSA, Free Pct: 33.2 %
PSA, Free: 2.29 ng/mL
Prostate Specific Ag, Serum: 6.9 ng/mL — ABNORMAL HIGH (ref 0.0–4.0)

## 2024-11-04 ENCOUNTER — Ambulatory Visit: Payer: Self-pay | Admitting: Urology

## 2024-11-05 ENCOUNTER — Ambulatory Visit: Admitting: Urology

## 2024-11-05 ENCOUNTER — Encounter: Payer: Self-pay | Admitting: Urology

## 2024-11-05 VITALS — BP 119/65 | HR 96

## 2024-11-05 DIAGNOSIS — R972 Elevated prostate specific antigen [PSA]: Secondary | ICD-10-CM

## 2024-11-05 DIAGNOSIS — R351 Nocturia: Secondary | ICD-10-CM

## 2024-11-05 DIAGNOSIS — N411 Chronic prostatitis: Secondary | ICD-10-CM

## 2024-11-05 LAB — URINALYSIS, ROUTINE W REFLEX MICROSCOPIC
Bilirubin, UA: NEGATIVE
Glucose, UA: NEGATIVE
Ketones, UA: NEGATIVE
Leukocytes,UA: NEGATIVE
Nitrite, UA: NEGATIVE
Protein,UA: NEGATIVE
Specific Gravity, UA: 1.015 (ref 1.005–1.030)
Urobilinogen, Ur: 0.2 mg/dL (ref 0.2–1.0)
pH, UA: 6 (ref 5.0–7.5)

## 2024-11-05 LAB — MICROSCOPIC EXAMINATION
Bacteria, UA: NONE SEEN
WBC, UA: NONE SEEN /HPF (ref 0–5)

## 2024-11-05 MED ORDER — FINASTERIDE 5 MG PO TABS
5.0000 mg | ORAL_TABLET | Freq: Every day | ORAL | 3 refills | Status: AC
Start: 2024-11-05 — End: ?

## 2024-11-05 MED ORDER — CEFUROXIME AXETIL 500 MG PO TABS: 500.0000 mg | ORAL_TABLET | Freq: Two times a day (BID) | ORAL | 0 refills | Status: AC

## 2024-11-05 NOTE — Progress Notes (Signed)
 11/05/2024 10:09 AM   Jeremy Casey 27-Apr-1951 [id number]  Referring provider: Seabron Lenis, MD [phone] [address] [place],  [place] [address]  Followup elevated PSA and BPH   HPI: Mr Bracco is a 73yo here for followup for elevated PSa and BPH with nocturia. PSA decreased to 6.9 from 7.6. He has been urinating well since being treated for prostatitis. IPSS 9 QOL 2 after Urolift. Nocturia 1-2x. He has occasional post void dribbling after Urolift. Urine stream strong. No straining to urinate.    PMH: Past Medical History:  Diagnosis Date   Crohn's disease (HCC)    Elevated PSA    Essential hypertension, benign    [facility]   Seasonal allergies     Surgical History: Past Surgical History:  Procedure Laterality Date   CYSTOSCOPY WITH INSERTION OF UROLIFT N/A 09/10/2023   Procedure: CYSTOSCOPY WITH INSERTION OF UROLIFT;  Surgeon: Sherrilee Belvie CROME, MD;  Location: [facility];  Service: Urology;  Laterality: N/A;   HIP SURGERY     KNEE SURGERY     MOUTH SURGERY  2014   repeat from root canal, 2 teeth pulled   PE tube placement  2002   secondary to serous otitis   ROOT CANAL  2013   failed    Home Medications:  Allergies as of 11/05/2024       Reactions   Tamsulosin Hcl Other (See Comments)   Drop in blood pressure    Amoxicillin Other (See Comments)   Gi- Upset        Medication List        Accurate as of November 05, 2024 10:09 AM. If you have any questions, ask your nurse or doctor.          aspirin 81 MG chewable tablet 1 tablet   cefUROXime  500 MG tablet Commonly known as: CEFTIN  Take 1 tablet (500 mg total) by mouth 2 (two) times daily with a meal.   clindamycin 1 % gel Commonly known as: CLINDAGEL Apply 1 Application topically daily as needed (pre skin cancer).   fluticasone 0.05 % cream Commonly known as: CUTIVATE Apply 1 Application topically daily as needed (pre cancer on face).   fluticasone 50 MCG/ACT nasal spray Commonly known  as: FLONASE Place 2 sprays into both nostrils daily.   loratadine 10 MG tablet Commonly known as: CLARITIN Take 10 mg by mouth daily.   metroNIDAZOLE 0.75 % cream Commonly known as: METROCREAM Apply 1 Application topically daily as needed (pre cancer).   olmesartan-hydrochlorothiazide 20-12.5 MG tablet Commonly known as: BENICAR HCT Take 0.5 tablets by mouth daily.   sulfamethoxazole -trimethoprim  800-160 MG tablet Commonly known as: BACTRIM  DS Take 1 tablet by mouth every 12 (twelve) hours.        Allergies:  Allergies  Allergen Reactions   Tamsulosin Hcl Other (See Comments)    Drop in blood pressure    Amoxicillin Other (See Comments)    Gi- Upset    Family History: Family History  Problem Relation Age of Onset   Hodgkin's lymphoma Father    Cancer Father    ALS Mother    Hypertension Brother    Heart attack Unknown     Social History:  reports that he has never smoked. He has never used smokeless tobacco. He reports that he does not currently use alcohol. He reports that he does not use drugs.  ROS: All other review of systems were reviewed and are negative except what is noted above in HPI  Physical Exam: BP 119/65   Pulse 96   Constitutional:  Alert and oriented, No acute distress. HEENT: [place] AT, moist mucus membranes.  Trachea midline, no masses. Cardiovascular: No clubbing, cyanosis, or edema. Respiratory: Normal respiratory effort, no increased work of breathing. GI: Abdomen is soft, nontender, nondistended, no abdominal masses GU: No CVA tenderness.  Lymph: No cervical or inguinal lymphadenopathy. Skin: No rashes, bruises or suspicious lesions. Neurologic: Grossly intact, no focal deficits, moving all 4 extremities. Psychiatric: Normal mood and affect.  Laboratory Data: Lab Results  Component Value Date   WBC 7.3 01/26/2022   HGB 15.1 01/26/2022   HCT 45.2 01/26/2022   MCV 91 01/26/2022   PLT 195 10/16/2016    Lab Results  Component  Value Date   CREATININE 1.19 09/10/2023    No results found for: PSA  No results found for: TESTOSTERONE  No results found for: HGBA1C  Urinalysis    Component Value Date/Time   COLORURINE YELLOW 10/16/2016 2320   APPEARANCEUR Clear 04/25/2024 1009   LABSPEC 1.010 10/16/2016 2320   PHURINE 5.5 10/16/2016 2320   GLUCOSEU Negative 04/25/2024 1009   HGBUR TRACE (A) 10/16/2016 2320   BILIRUBINUR Negative 04/25/2024 1009   KETONESUR negative 08/11/2023 1329   KETONESUR 40 (A) 10/16/2016 2320   PROTEINUR Negative 04/25/2024 1009   PROTEINUR NEGATIVE 10/16/2016 2320   UROBILINOGEN 0.2 08/11/2023 1329   NITRITE Negative 04/25/2024 1009   NITRITE NEGATIVE 10/16/2016 2320   LEUKOCYTESUR Negative 04/25/2024 1009    Lab Results  Component Value Date   LABMICR See below: 04/25/2024   WBCUA 0-5 04/25/2024   LABEPIT 0-10 04/25/2024   MUCUS Present 03/30/2023   BACTERIA None seen 04/25/2024    Pertinent Imaging:  No results found for this or any previous visit.  No results found for this or any previous visit.  No results found for this or any previous visit.  No results found for this or any previous visit.  No results found for this or any previous visit.  No results found for this or any previous visit.  No results found for this or any previous visit.  No results found for this or any previous visit.   Assessment & Plan:    1. Elevated prostate specific antigen (PSA) (Primary) Followup 6 months with PSA - Urinalysis, Routine w reflex microscopic  2. Nocturia Improved with Urolift. We will finasteride  5mg  daily - Urinalysis, Routine w reflex microscopic   No follow-ups on file.  Belvie Clara, MD  [facility] [place] [facility]

## 2024-11-05 NOTE — Patient Instructions (Signed)

## 2025-05-12 ENCOUNTER — Other Ambulatory Visit

## 2025-05-20 ENCOUNTER — Ambulatory Visit: Admitting: Urology
